# Patient Record
Sex: Male | Born: 1985 | Race: White | Hispanic: No | Marital: Married | Smoking: Current every day smoker
Health system: Southern US, Community
[De-identification: ages and names within clinical notes are randomized; demographics above are authoritative.]

---
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
## 2012-12-15 ENCOUNTER — Emergency Department (HOSPITAL_COMMUNITY)
Admission: EM | Admit: 2012-12-15 | Discharge: 2012-12-15 | Disposition: A | Discharge status: Home / Self Care | Payer: Self-pay | Attending: Emergency Medicine | Admitting: Emergency Medicine

## 2012-12-15 ENCOUNTER — Emergency Department (HOSPITAL_COMMUNITY): Payer: Self-pay

## 2012-12-15 ENCOUNTER — Encounter (HOSPITAL_COMMUNITY): Payer: Self-pay | Admitting: Emergency Medicine

## 2012-12-15 DIAGNOSIS — S02400A Malar fracture unspecified, initial encounter for closed fracture: Secondary | ICD-10-CM | POA: Insufficient documentation

## 2012-12-15 DIAGNOSIS — F17200 Nicotine dependence, unspecified, uncomplicated: Secondary | ICD-10-CM | POA: Insufficient documentation

## 2012-12-15 DIAGNOSIS — S02402A Zygomatic fracture, unspecified, initial encounter for closed fracture: Secondary | ICD-10-CM

## 2012-12-15 DIAGNOSIS — S02401A Maxillary fracture, unspecified, initial encounter for closed fracture: Secondary | ICD-10-CM | POA: Insufficient documentation

## 2012-12-15 MED ORDER — OXYCODONE-ACETAMINOPHEN 5-325 MG PO TABS
2.0000 | ORAL_TABLET | Freq: Once | ORAL | Status: AC
  Administered 2012-12-15: 2 via ORAL
  Filled 2012-12-15: qty 2

## 2012-12-15 MED ORDER — OXYCODONE-ACETAMINOPHEN 5-325 MG PO TABS: 1.0000 | ORAL_TABLET | ORAL | Status: DC | PRN

## 2012-12-15 MED ORDER — ONDANSETRON 4 MG PO TBDP
4.0000 mg | ORAL_TABLET | Freq: Once | ORAL | Status: AC
  Administered 2012-12-15: 4 mg via ORAL
  Filled 2012-12-15: qty 1
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
## 2012-12-16 ENCOUNTER — Encounter (HOSPITAL_COMMUNITY): Payer: Self-pay | Admitting: *Deleted

## 2012-12-16 ENCOUNTER — Inpatient Hospital Stay (HOSPITAL_COMMUNITY): Payer: Self-pay | Admitting: Anesthesiology

## 2012-12-16 ENCOUNTER — Encounter (HOSPITAL_COMMUNITY): Payer: Self-pay | Admitting: Anesthesiology

## 2012-12-16 ENCOUNTER — Ambulatory Visit (HOSPITAL_COMMUNITY)
Admission: AD | Admit: 2012-12-16 | Discharge: 2012-12-16 | Disposition: A | Discharge status: Home / Self Care | Payer: Self-pay | Source: Ambulatory Visit | Attending: Otolaryngology | Admitting: Otolaryngology

## 2012-12-16 ENCOUNTER — Encounter (HOSPITAL_COMMUNITY)
Admission: AD | Disposition: A | Discharge status: Home / Self Care | Payer: Self-pay | Source: Ambulatory Visit | Attending: Otolaryngology

## 2012-12-16 DIAGNOSIS — S02402A Zygomatic fracture, unspecified, initial encounter for closed fracture: Secondary | ICD-10-CM

## 2012-12-16 DIAGNOSIS — S02401A Maxillary fracture, unspecified, initial encounter for closed fracture: Secondary | ICD-10-CM | POA: Insufficient documentation

## 2012-12-16 DIAGNOSIS — S02400A Malar fracture unspecified, initial encounter for closed fracture: Secondary | ICD-10-CM | POA: Insufficient documentation

## 2012-12-16 HISTORY — PX: ORIF TRIPOD FRACTURE: SHX5211

## 2012-12-16 LAB — CBC
HCT: 43.5 % (ref 39.0–52.0)
Hemoglobin: 16 g/dL (ref 13.0–17.0)
MCH: 32.3 pg (ref 26.0–34.0)
MCHC: 36.8 g/dL — ABNORMAL HIGH (ref 30.0–36.0)
MCV: 87.7 fL (ref 78.0–100.0)

## 2012-12-16 SURGERY — OPEN REDUCTION INTERNAL FIXATION (ORIF) TRIPOD FRACTURE
Anesthesia: General | Site: Face | Laterality: Left | Wound class: Clean Contaminated

## 2012-12-16 MED ORDER — ONDANSETRON HCL 4 MG/2ML IJ SOLN
INTRAMUSCULAR | Status: DC | PRN
  Administered 2012-12-16: 4 mg via INTRAVENOUS

## 2012-12-16 MED ORDER — BACITRACIN ZINC 500 UNIT/GM EX OINT
TOPICAL_OINTMENT | CUTANEOUS | Status: AC
  Filled 2012-12-16: qty 15

## 2012-12-16 MED ORDER — CEFAZOLIN SODIUM 1-5 GM-% IV SOLN
INTRAVENOUS | Status: AC
  Filled 2012-12-16: qty 100

## 2012-12-16 MED ORDER — OXYCODONE HCL 5 MG/5ML PO SOLN: 5.0000 mg | Freq: Once | ORAL | Status: AC | PRN

## 2012-12-16 MED ORDER — AMOXICILLIN 875 MG PO TABS: 875.0000 mg | ORAL_TABLET | Freq: Two times a day (BID) | ORAL | Status: AC

## 2012-12-16 MED ORDER — HYDROMORPHONE HCL PF 1 MG/ML IJ SOLN
0.2500 mg | INTRAMUSCULAR | Status: DC | PRN
  Administered 2012-12-16 (×2): 0.5 mg via INTRAVENOUS

## 2012-12-16 MED ORDER — OXYMETAZOLINE HCL 0.05 % NA SOLN
NASAL | Status: AC
  Filled 2012-12-16: qty 15

## 2012-12-16 MED ORDER — OXYCODONE HCL 5 MG PO TABS
5.0000 mg | ORAL_TABLET | Freq: Once | ORAL | Status: AC | PRN
  Administered 2012-12-16: 5 mg via ORAL

## 2012-12-16 MED ORDER — HYDROMORPHONE HCL PF 1 MG/ML IJ SOLN
INTRAMUSCULAR | Status: AC
  Filled 2012-12-16: qty 1

## 2012-12-16 MED ORDER — CEFAZOLIN SODIUM-DEXTROSE 2-3 GM-% IV SOLR
INTRAVENOUS | Status: DC | PRN
  Administered 2012-12-16: 2 g via INTRAVENOUS

## 2012-12-16 MED ORDER — LIDOCAINE HCL (CARDIAC) 20 MG/ML IV SOLN
INTRAVENOUS | Status: DC | PRN
  Administered 2012-12-16: 100 mg via INTRAVENOUS

## 2012-12-16 MED ORDER — PROPOFOL 10 MG/ML IV BOLUS
INTRAVENOUS | Status: DC | PRN
  Administered 2012-12-16: 200 mg via INTRAVENOUS

## 2012-12-16 MED ORDER — ARTIFICIAL TEARS OP OINT
TOPICAL_OINTMENT | OPHTHALMIC | Status: DC | PRN
  Administered 2012-12-16: 1 via OPHTHALMIC

## 2012-12-16 MED ORDER — OXYCODONE HCL 5 MG PO TABS
ORAL_TABLET | ORAL | Status: AC
  Filled 2012-12-16: qty 1

## 2012-12-16 MED ORDER — PROMETHAZINE HCL 25 MG/ML IJ SOLN: 6.2500 mg | INTRAMUSCULAR | Status: DC | PRN

## 2012-12-16 MED ORDER — SODIUM CHLORIDE 0.9 % IR SOLN
Status: DC | PRN
  Administered 2012-12-16: 1000 mL

## 2012-12-16 MED ORDER — FENTANYL CITRATE 0.05 MG/ML IJ SOLN: 50.0000 ug | Freq: Once | INTRAMUSCULAR | Status: DC

## 2012-12-16 MED ORDER — LIDOCAINE-EPINEPHRINE 1 %-1:100000 IJ SOLN
INTRAMUSCULAR | Status: DC | PRN
  Administered 2012-12-16: 20 mL

## 2012-12-16 MED ORDER — TOBRAMYCIN-DEXAMETHASONE 0.3-0.1 % OP OINT
TOPICAL_OINTMENT | OPHTHALMIC | Status: AC
  Filled 2012-12-16: qty 3.5

## 2012-12-16 MED ORDER — LACTATED RINGERS IV SOLN
INTRAVENOUS | Status: DC | PRN
  Administered 2012-12-16: 19:00:00 via INTRAVENOUS

## 2012-12-16 MED ORDER — MIDAZOLAM HCL 5 MG/5ML IJ SOLN
INTRAMUSCULAR | Status: DC | PRN
  Administered 2012-12-16: 2 mg via INTRAVENOUS

## 2012-12-16 MED ORDER — OXYCODONE-ACETAMINOPHEN 5-325 MG PO TABS: 1.0000 | ORAL_TABLET | ORAL | Status: AC | PRN

## 2012-12-16 MED ORDER — METOCLOPRAMIDE HCL 5 MG/ML IJ SOLN
INTRAMUSCULAR | Status: DC | PRN
  Administered 2012-12-16: 10 mg via INTRAVENOUS

## 2012-12-16 MED ORDER — LACTATED RINGERS IV SOLN: INTRAVENOUS | Status: DC

## 2012-12-16 MED ORDER — SUCCINYLCHOLINE CHLORIDE 20 MG/ML IJ SOLN
INTRAMUSCULAR | Status: DC | PRN
  Administered 2012-12-16: 100 mg via INTRAVENOUS

## 2012-12-16 MED ORDER — BSS IO SOLN
INTRAOCULAR | Status: AC
  Filled 2012-12-16: qty 15

## 2012-12-16 MED ORDER — MIDAZOLAM HCL 2 MG/2ML IJ SOLN: 1.0000 mg | INTRAMUSCULAR | Status: DC | PRN

## 2012-12-16 MED ORDER — LIDOCAINE-EPINEPHRINE 1 %-1:100000 IJ SOLN
INTRAMUSCULAR | Status: AC
  Filled 2012-12-16: qty 1

## 2012-12-16 MED ORDER — FENTANYL CITRATE 0.05 MG/ML IJ SOLN
INTRAMUSCULAR | Status: DC | PRN
  Administered 2012-12-16: 50 ug via INTRAVENOUS
  Administered 2012-12-16: 100 ug via INTRAVENOUS
  Administered 2012-12-16: 50 ug via INTRAVENOUS

## 2012-12-16 SURGICAL SUPPLY — 35 items
BAG DECANTER FOR FLEXI CONT (MISCELLANEOUS) IMPLANT
CANISTER SUCTION 2500CC (MISCELLANEOUS) ×2 IMPLANT
CLEANER TIP ELECTROSURG 2X2 (MISCELLANEOUS) ×2 IMPLANT
CLOTH BEACON ORANGE TIMEOUT ST (SAFETY) ×2 IMPLANT
CONFORMERS SILICONE 5649 (OPHTHALMIC RELATED) IMPLANT
ELECT COATED BLADE 2.86 ST (ELECTRODE) ×1 IMPLANT
ELECT NDL BLADE 2-5/6 (NEEDLE) IMPLANT
ELECT NEEDLE BLADE 2-5/6 (NEEDLE) IMPLANT
ELECT REM PT RETURN 9FT ADLT (ELECTROSURGICAL) ×2
ELECTRODE REM PT RTRN 9FT ADLT (ELECTROSURGICAL) ×1 IMPLANT
GLOVE BIOGEL PI IND STRL 8 (GLOVE) IMPLANT
GLOVE BIOGEL PI INDICATOR 8 (GLOVE) ×1
GLOVE ECLIPSE 7.5 STRL STRAW (GLOVE) ×2 IMPLANT
GOWN STRL NON-REIN LRG LVL3 (GOWN DISPOSABLE) ×4 IMPLANT
KIT BASIN OR (CUSTOM PROCEDURE TRAY) ×2 IMPLANT
KIT ROOM TURNOVER OR (KITS) ×2 IMPLANT
MASK EYE SHIELD (GAUZE/BANDAGES/DRESSINGS) ×1 IMPLANT
NS IRRIG 1000ML POUR BTL (IV SOLUTION) ×2 IMPLANT
PAD ARMBOARD 7.5X6 YLW CONV (MISCELLANEOUS) ×4 IMPLANT
PENCIL BUTTON HOLSTER BLD 10FT (ELECTRODE) ×2 IMPLANT
PLATE 8 H STRAIGHT MID FACE (Plate) ×1 IMPLANT
SCISSORS WIRE ANG 4 3/4 DISP (INSTRUMENTS) IMPLANT
SCREW MIDFACE 1.7X3 SLF DRILL (Screw) ×4 IMPLANT
SUT PLAIN 6 0 TG1408 (SUTURE) IMPLANT
SUT PROLENE 5 0 C1 (SUTURE) IMPLANT
SUT PROLENE 5 0 RB 2 (SUTURE) ×1 IMPLANT
SUT VIC AB 3-0 FS2 27 (SUTURE) IMPLANT
SUT VIC AB 4-0 SH 27 (SUTURE) ×2
SUT VIC AB 4-0 SH 27XBRD (SUTURE) IMPLANT
TOWEL OR 17X24 6PK STRL BLUE (TOWEL DISPOSABLE) ×2 IMPLANT
TOWEL OR 17X26 10 PK STRL BLUE (TOWEL DISPOSABLE) ×2 IMPLANT
TRAY ENT MC OR (CUSTOM PROCEDURE TRAY) ×2 IMPLANT
TUBING IRRIGATION (MISCELLANEOUS) IMPLANT
WATER STERILE IRR 1000ML POUR (IV SOLUTION) ×1 IMPLANT
YANKAUER SUCT BULB TIP NO VENT (SUCTIONS) ×1 IMPLANT

## 2012-12-16 NOTE — Progress Notes (Signed)
DtSuszanne Conners at bedside states pt will have blereding to just change nasal drip pad as needed

## 2012-12-16 NOTE — Brief Op Note (Signed)
12/16/2012  8:16 PM  PATIENT:  Nicholas [name]  27 y.o. male  PRE-OPERATIVE DIAGNOSIS:  Displaced left Tripod Fx  POST-OPERATIVE DIAGNOSIS:  Displaced left Tripod Fx  PROCEDURE:  Procedure(s): OPEN REDUCTION INTERNAL FIXATION (ORIF) LEFT TRIPOD FRACTURE VIA MULTIPLE APPROACHES  SURGEON:  Surgeon(s) and Role:    * Sui W Ahilyn Nell, MD - Primary  PHYSICIAN ASSISTANT:   ASSISTANTS: none   ANESTHESIA:   general  EBL:     BLOOD ADMINISTERED:none  DRAINS: none   LOCAL MEDICATIONS USED:  LIDOCAINE   SPECIMEN:  No Specimen  DISPOSITION OF SPECIMEN:  N/A  COUNTS:  YES  TOURNIQUET:  * No tourniquets in log *  DICTATION: .Other Dictation: Dictation Number [id number]  PLAN OF CARE: Discharge to home after PACU  PATIENT DISPOSITION:  PACU - hemodynamically stable.   Delay start of Pharmacological VTE agent (>24hrs) due to surgical blood loss or risk of bleeding: not applicable
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
## 2012-12-16 NOTE — Progress Notes (Signed)
Drip pad changed second time for moderate amount of bright red blood

## 2012-12-16 NOTE — Transfer of Care (Signed)
Immediate Anesthesia Transfer of Care Note  Patient: Nicholas [name]  Procedure(s) Performed: Procedure(s): OPEN REDUCTION INTERNAL FIXATION (ORIF) TRIPOD FRACTURE with multiple approaches (Left)  Patient Location: PACU  Anesthesia Type:General  Level of Consciousness: awake, alert  and oriented  Airway & Oxygen Therapy: Patient Spontanous Breathing  Post-op Assessment: Report given to PACU RN, Post -op Vital signs reviewed and stable and Patient moving all extremities X 4  Post vital signs: Reviewed and stable  Complications: No apparent anesthesia complications
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
## 2012-12-16 NOTE — Progress Notes (Signed)
Dr. Suszanne Conners called pt has bleeding from nose drip pad applied

## 2012-12-16 NOTE — Anesthesia Procedure Notes (Signed)
Procedure Name: Intubation Date/Time: 12/16/2012 7:12 PM Performed by: Molli Hazard Pre-anesthesia Checklist: Patient identified, Emergency Drugs available, Suction available and Patient being monitored Patient Re-evaluated:Patient Re-evaluated prior to inductionOxygen Delivery Method: Circle system utilized Preoxygenation: Pre-oxygenation with 100% oxygen Intubation Type: IV induction Ventilation: Mask ventilation without difficulty Laryngoscope Size: Miller and 2 Grade View: Grade I Tube type: Oral Tube size: 7.5 mm Number of attempts: 1 Airway Equipment and Method: Stylet Placement Confirmation: ETT inserted through vocal cords under direct vision,  positive ETCO2 and breath sounds checked- equal and bilateral Secured at: 22 cm Tube secured with: Tape Dental Injury: Teeth and Oropharynx as per pre-operative assessment

## 2012-12-16 NOTE — Preoperative (Signed)
Beta Blockers   Reason not to administer Beta Blockers:Not Applicable 

## 2012-12-17 ENCOUNTER — Encounter (HOSPITAL_COMMUNITY): Payer: Self-pay | Admitting: Otolaryngology

## 2012-12-17 NOTE — Anesthesia Postprocedure Evaluation (Signed)
  Anesthesia Post-op Note  Patient: Nicholas [name]  Procedure(s) Performed: Procedure(s): OPEN REDUCTION INTERNAL FIXATION (ORIF) TRIPOD FRACTURE with multiple approaches (Left)  Patient Location: PACU  Anesthesia Type:General  Level of Consciousness: awake and alert   Airway and Oxygen Therapy: Patient Spontanous Breathing  Post-op Pain: mild  Post-op Assessment: Post-op Vital signs reviewed, Patient's Cardiovascular Status Stable, Respiratory Function Stable, Patent Airway, No signs of Nausea or vomiting and Pain level controlled  Post-op Vital Signs: stable  Complications: No apparent anesthesia complications
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
## 2012-12-18 NOTE — ED Provider Notes (Signed)
Medical screening examination/treatment/procedure(s) were performed by non-physician practitioner and as supervising physician I was immediately available for consultation/collaboration.  Toy Baker, MD 12/18/12 [phone]
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
## 2019-09-01 ENCOUNTER — Ambulatory Visit (INDEPENDENT_AMBULATORY_CARE_PROVIDER_SITE_OTHER): Payer: Self-pay | Admitting: Adult Health

## 2019-09-01 ENCOUNTER — Encounter: Payer: Self-pay | Admitting: Adult Health

## 2019-09-01 VITALS — BP 118/72 | HR 84 | Ht 70.0 in | Wt 142.0 lb

## 2019-09-01 DIAGNOSIS — F411 Generalized anxiety disorder: Secondary | ICD-10-CM

## 2019-09-01 DIAGNOSIS — F902 Attention-deficit hyperactivity disorder, combined type: Secondary | ICD-10-CM

## 2019-09-01 NOTE — Progress Notes (Signed)
[facility] MD/PA/NP Initial Note  09/01/2019 10:46 AM Nicholas [name]  MRN:  [id number]  Chief Complaint:   HPI: Describes mood today as - Mood symptoms - reports anxiety "most" of the times. Stating "I get so nervous sometimes, I can't do what I need to do". Gets "more" irritated at things that may not bother other people. Gets overstimulated easy. Denies depression. Would like Stable interest and motivation. Taking medications as prescribed.  Energy levels stable. Active, does not have a regular exercise routine. Works full-time - 40 hours. Enjoys some usual interests and activities. Single. Married - together 5 years. Has a 51 year old son (wife's son) and an 59 month old daughter. Family local. Both sets of parents live in [place]. Spending time with family. Appetite adequate - "it comes and goes". Weight stable up until a year ago. Increased waist by 3 inches over the past hear.  Sleeps well most nights. Averages 7 to 8 hours.feels "rested". Focus and concentration difficulties. Previously treated for and diagnosed with ADHD by Dr. Sabra Heck - age 65. Completing tasks. Managing aspects of household. 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 Denies SI or HI. Denies AH or VH.  Previous medication trials: Ritalin, Adderall, Concerta, Stratera  Visit Diagnosis:    ICD-10-CM   1. Generalized anxiety disorder  F41.1   2. Attention deficit hyperactivity disorder (ADHD), unspecified ADHD type  F90.9     Past Psychiatric History: Denies psychiatric hospitalization.  Past Medical History: No past medical history on file.   Family Psychiatric History: Mother - unknown.  Family History: No family history on file.  Social History:  Social History   Socioeconomic History  . Marital status: Single    Spouse name: Not on file  . Number of children: Not on file  . Years of education: Not on file  . Highest education level: Not on file  Occupational History  . Not on file   Tobacco Use  . Smoking status: Not on file  Substance and Sexual Activity  . Alcohol use: Not on file  . Drug use: Not on file  . Sexual activity: Not on file  Other Topics Concern  . Not on file  Social History Narrative  . Not on file   Social Determinants of Health   Financial Resource Strain:   . Difficulty of Paying Living Expenses:   Food Insecurity:   . Worried About Charity fundraiser in the Last Year:   . Arboriculturist in the Last Year:   Transportation Needs:   . Film/video editor (Medical):   Marland Kitchen Lack of Transportation (Non-Medical):   Physical Activity:   . Days of Exercise per Week:   . Minutes of Exercise per Session:   Stress:   . Feeling of Stress :   Social Connections:   . Frequency of Communication with Friends and Family:   . Frequency of Social Gatherings with Friends and Family:   . Attends Religious Services:   . Active Member of Clubs or Organizations:   . Attends Archivist Meetings:   Marland Kitchen Marital Status:     Allergies: Not on File  Metabolic Disorder Labs: No results found for: HGBA1C, MPG No results found for: PROLACTIN No results found for: CHOL, TRIG, HDL, CHOLHDL, VLDL, LDLCALC No results found for: TSH  Therapeutic Level Labs: No results found for: LITHIUM No results found for: VALPROATE No components found for:  CBMZ  Current  Medications: No current outpatient medications on file.   No current facility-administered medications for this visit.    Medication Side Effects: none  Orders placed this visit:  No orders of the defined types were placed in this encounter.  Psychiatric Specialty Exam:  Review of Systems  Blood pressure 130/81, pulse 82, height 5\' 10"  (1.778 m), weight 180 lb (81.6 kg).Body mass index is 25.83 kg/m.  General Appearance: Neat and Well Groomed  Eye Contact:  Good  Speech:  Clear and Coherent and Normal Rate  Volume:  Normal  Mood:  Anxious  Affect:  Appropriate and Congruent   Thought Process:  Coherent and Descriptions of Associations: Intact  Orientation:  Full (Time, Place, and Person)  Thought Content: Logical   Suicidal Thoughts:  No  Homicidal Thoughts:  No  Memory:  WNL  Judgement:  Good  Insight:  Good  Psychomotor Activity:  Normal  Concentration:  Concentration: Good  Recall:  Good  Fund of Knowledge: Good  Language: Good  Assets:  Communication Skills Desire for Improvement Financial Resources/Insurance Housing Intimacy Leisure Time Physical Health Resilience Social Support Talents/Skills Transportation Vocational/Educational  ADL's:  Intact  Cognition: WNL  Prognosis:  Good   Screenings: ADHD  Receiving Psychotherapy: No   Treatment Plan/Recommendations:  Plan:  PDMP reviewed  1. Add Adderall XR 20mg  every morning  Psych Central ADHD testing score - 42/58  RTC 4 weeks  Patient advised to contact office with any questions, adverse effects, or acute worsening in signs and symptoms.  Discussed potential benefits, risks, and side effects of stimulants with patient to include increased heart rate, palpitations, insomnia, increased anxiety, increased irritability, or decreased appetite.  Instructed patient to contact office if experiencing any significant tolerability issues.  Greater than 50% of face to face time with patient was spent on counseling and coordination of care. We discussed ADHD - diet, vitamin supplements, medication, ADDitude website.   , NP

## 2019-09-29 ENCOUNTER — Other Ambulatory Visit: Payer: Self-pay

## 2019-09-29 ENCOUNTER — Ambulatory Visit (INDEPENDENT_AMBULATORY_CARE_PROVIDER_SITE_OTHER): Payer: Self-pay | Admitting: Adult Health

## 2019-09-29 ENCOUNTER — Encounter: Payer: Self-pay | Admitting: Adult Health

## 2019-09-29 DIAGNOSIS — F902 Attention-deficit hyperactivity disorder, combined type: Secondary | ICD-10-CM

## 2019-09-29 DIAGNOSIS — F411 Generalized anxiety disorder: Secondary | ICD-10-CM

## 2019-09-29 MED ORDER — AMPHETAMINE-DEXTROAMPHETAMINE 20 MG PO TABS: 20.0000 mg | ORAL_TABLET | Freq: Two times a day (BID) | ORAL | 0 refills | Status: DC

## 2019-09-29 NOTE — Progress Notes (Signed)
Nicholas [name] [id number] [birth year] 34 y.o.  Subjective:   Patient ID:  Nicholas [name] is a 34 y.o. (DOB 01-Oct-1985) male.  Chief Complaint: No chief complaint on file.   HPI Nicholas [name] presents to the office today for follow-up of ADHD and GAD.  Describes mood today as "ok". Pleasant. Mood symptoms - reports decreased anxiety - "that is better, but still there". Not getting as irritated over things. Denies depression. Feels like addition of Adderall has ben helpful. Woould like to try the immediate release instead of the XR - "it may be keeping me too activated" - some difficulties getting to sleep. Stable interest and motivation. Taking medications as prescribed.  Energy levels stable. Active, does not have a regular exercise routine. Works full-time - 40 hours. Enjoys some usual interests and activities. Single. Married - together 5 years. Has a 35 year old son (wife's son) and an 11 month old daughter. Family local. Both sets of parents live in [place]. Spending time with family. Appetite adequate. Weight loss - 174 pounds - lost 6 pounds.  Sleeps well most nights. Averages 7 to 8 hours.  Focus and concentration has improved. Completing tasks. Managing aspects of household. Works as a Psychologist, occupational.  Denies SI or HI. Denies AH or VH.  Previous medication trials: Ritalin, Adderall, Concerta, Stratera   Review of Systems:  Review of Systems  Musculoskeletal: Negative for gait problem.  Neurological: Negative for tremors.  Psychiatric/Behavioral:       Please refer to HPI    Medications: I have reviewed the patient's current medications.  Current Outpatient Medications  Medication Sig Dispense Refill  . amphetamine-dextroamphetamine (ADDERALL) 20 MG tablet Take 1 tablet (20 mg total) by mouth 2 (two) times daily. 60 tablet 0  . ibuprofen (ADVIL,MOTRIN) 200 MG tablet Take 1,000 mg by mouth every 6 (six) hours as needed for pain.    Marland Kitchen oxyCODONE-acetaminophen (ROXICET) 5-325 MG  per tablet Take 1-2 tablets by mouth every 4 (four) hours as needed for pain. 30 tablet 0   No current facility-administered medications for this visit.    Medication Side Effects: None  Allergies: No Known Allergies  No past medical history on file.  No family history on file.  Social History   Socioeconomic History  . Marital status: Single    Spouse name: Not on file  . Number of children: Not on file  . Years of education: Not on file  . Highest education level: Not on file  Occupational History  . Not on file  Tobacco Use  . Smoking status: Current Every Day Smoker    Packs/day: 1.00    Years: 6.00    Pack years: 6.00  . Smokeless tobacco: Never Used  Substance and Sexual Activity  . Alcohol use: Yes    Comment: occasionally  . Drug use: Yes    Types: Marijuana    Comment: last used on 12-15-12  . Sexual activity: Yes  Other Topics Concern  . Not on file  Social History Narrative  . Not on file   Social Determinants of Health   Financial Resource Strain:   . Difficulty of Paying Living Expenses:   Food Insecurity:   . Worried About Programme researcher, broadcasting/film/video in the Last Year:   . Barista in the Last Year:   Transportation Needs:   . Freight forwarder (Medical):   Marland Kitchen Lack of Transportation (Non-Medical):   Physical Activity:   . Days of Exercise per  Week:   . Minutes of Exercise per Session:   Stress:   . Feeling of Stress :   Social Connections:   . Frequency of Communication with Friends and Family:   . Frequency of Social Gatherings with Friends and Family:   . Attends Religious Services:   . Active Member of Clubs or Organizations:   . Attends Archivist Meetings:   Marland Kitchen Marital Status:   Intimate Partner Violence:   . Fear of Current or Ex-Partner:   . Emotionally Abused:   Marland Kitchen Physically Abused:   . Sexually Abused:     Past Medical History, Surgical history, Social history, and Family history were reviewed and updated as  appropriate.   Please see review of systems for further details on the patient's review from today.   Objective:   Physical Exam:  There were no vitals taken for this visit.  Physical Exam Constitutional:      General: He is not in acute distress. Musculoskeletal:        General: No deformity.  Neurological:     Mental Status: He is alert and oriented to person, place, and time.     Coordination: Coordination normal.  Psychiatric:        Attention and Perception: Attention and perception normal. He does not perceive auditory or visual hallucinations.        Mood and Affect: Mood normal. Mood is not anxious or depressed. Affect is not labile, blunt, angry or inappropriate.        Speech: Speech normal.        Behavior: Behavior normal.        Thought Content: Thought content normal. Thought content is not paranoid or delusional. Thought content does not include homicidal or suicidal ideation. Thought content does not include homicidal or suicidal plan.        Cognition and Memory: Cognition and memory normal.        Judgment: Judgment normal.     Comments: Insight intact     Lab Review:  No results found for: NA, K, CL, CO2, GLUCOSE, BUN, CREATININE, CALCIUM, PROT, ALBUMIN, AST, ALT, ALKPHOS, BILITOT, GFRNONAA, GFRAA     Component Value Date/Time   WBC 8.7 12/16/2012 1444   RBC 4.96 12/16/2012 1444   HGB 16.0 12/16/2012 1444   HCT 43.5 12/16/2012 1444   PLT 190 12/16/2012 1444   MCV 87.7 12/16/2012 1444   MCH 32.3 12/16/2012 1444   MCHC 36.8 (H) 12/16/2012 1444   RDW 13.2 12/16/2012 1444    No results found for: POCLITH, LITHIUM   No results found for: PHENYTOIN, PHENOBARB, VALPROATE, CBMZ   .res Assessment: Plan:    Plan:  PDMP reviewed  1. D/C Adderall XR 20mg  every morning 2. Add Adderal 20mg  BID  Consider low dose Tranxene for anxiety  Psych Central ADHD testing score - 42/58  RTC 4 weeks  Patient advised to contact office with any questions,  adverse effects, or acute worsening in signs and symptoms.  Discussed potential benefits, risks, and side effects of stimulants with patient to include increased heart rate, palpitations, insomnia, increased anxiety, increased irritability, or decreased appetite.  Instructed patient to contact office if experiencing any significant tolerability issues.    Diagnoses and all orders for this visit:  Attention deficit hyperactivity disorder (ADHD), combined type -     amphetamine-dextroamphetamine (ADDERALL) 20 MG tablet; Take 1 tablet (20 mg total) by mouth 2 (two) times daily.  Generalized anxiety disorder  Please see After Visit Summary for patient specific instructions.  No future appointments.  No orders of the defined types were placed in this encounter.   -------------------------------

## 2019-10-27 ENCOUNTER — Ambulatory Visit: Payer: Self-pay | Admitting: Adult Health

## 2019-11-11 ENCOUNTER — Ambulatory Visit: Payer: Self-pay | Admitting: Adult Health

## 2019-11-17 ENCOUNTER — Other Ambulatory Visit: Payer: Self-pay

## 2019-11-17 ENCOUNTER — Ambulatory Visit (INDEPENDENT_AMBULATORY_CARE_PROVIDER_SITE_OTHER): Payer: Self-pay | Admitting: Adult Health

## 2019-11-17 ENCOUNTER — Encounter: Payer: Self-pay | Admitting: Adult Health

## 2019-11-17 DIAGNOSIS — F902 Attention-deficit hyperactivity disorder, combined type: Secondary | ICD-10-CM | POA: Insufficient documentation

## 2019-11-17 DIAGNOSIS — F411 Generalized anxiety disorder: Secondary | ICD-10-CM | POA: Insufficient documentation

## 2019-11-17 DIAGNOSIS — F909 Attention-deficit hyperactivity disorder, unspecified type: Secondary | ICD-10-CM

## 2019-11-17 MED ORDER — AMPHETAMINE-DEXTROAMPHETAMINE 20 MG PO TABS: 20.0000 mg | ORAL_TABLET | Freq: Two times a day (BID) | ORAL | 0 refills | Status: DC

## 2019-11-17 NOTE — Progress Notes (Signed)
Nicholas [name] [id number] 30-Nov-1985 34 y.o.  Subjective:   Patient ID:  Nicholas [name] is a 34 y.o. (DOB 1986-03-20) male.  Chief Complaint: No chief complaint on file.   HPI Nicholas [name] presents to the office today for follow-up of ADHD and GAD.  Describes mood today as "ok". Pleasant. Mood symptoms - reports anxiety at times. Denies depression. Decreased irritability. Stating "I feel fantastic". Feels like the immediate release Adderall is work well. Feels more "even" on it. Stable interest and motivation. Taking medications as prescribed.  Energy levels stable. Active, does not have a regular exercise routine. Works full-time - 40 hours. Enjoys some usual interests and activities. Single. Married - together 5 years. Has a 53 year old son (wife's son) and an 55 month old daughter. Family local. Both sets of parents live in [place]. Spending time with family. Appetite adequate. Weight stable. Sleeps well most nights. Averages 7 to 8 hours.  Focus and concentration has improved. Completing tasks. Managing aspects of household. Works as a Psychologist, occupational.   Denies SI or HI. Denies AH or VH.  Previous medication trials: Ritalin, Adderall, Concerta, Stratera   Review of Systems:  Review of Systems  Musculoskeletal: Negative for gait problem.  Neurological: Negative for tremors.  Psychiatric/Behavioral:       Please refer to HPI    Medications: I have reviewed the patient's current medications.  Current Outpatient Medications  Medication Sig Dispense Refill  . amphetamine-dextroamphetamine (ADDERALL) 20 MG tablet Take 1 tablet (20 mg total) by mouth 2 (two) times daily. 60 tablet 0  . [START ON 12/15/2019] amphetamine-dextroamphetamine (ADDERALL) 20 MG tablet Take 1 tablet (20 mg total) by mouth 2 (two) times daily. 60 tablet 0  . [START ON 01/12/2020] amphetamine-dextroamphetamine (ADDERALL) 20 MG tablet Take 1 tablet (20 mg total) by mouth 2 (two) times daily. 60 tablet 0  .  ibuprofen (ADVIL,MOTRIN) 200 MG tablet Take 1,000 mg by mouth every 6 (six) hours as needed for pain.    Marland Kitchen oxyCODONE-acetaminophen (ROXICET) 5-325 MG per tablet Take 1-2 tablets by mouth every 4 (four) hours as needed for pain. 30 tablet 0   No current facility-administered medications for this visit.    Medication Side Effects: None  Allergies: No Known Allergies  No past medical history on file.  No family history on file.  Social History   Socioeconomic History  . Marital status: Single    Spouse name: Not on file  . Number of children: Not on file  . Years of education: Not on file  . Highest education level: Not on file  Occupational History  . Not on file  Tobacco Use  . Smoking status: Current Every Day Smoker    Packs/day: 1.00    Years: 6.00    Pack years: 6.00  . Smokeless tobacco: Never Used  Substance and Sexual Activity  . Alcohol use: Yes    Comment: occasionally  . Drug use: Yes    Types: Marijuana    Comment: last used on 12-15-12  . Sexual activity: Yes  Other Topics Concern  . Not on file  Social History Narrative  . Not on file   Social Determinants of Health   Financial Resource Strain:   . Difficulty of Paying Living Expenses:   Food Insecurity:   . Worried About Programme researcher, broadcasting/film/video in the Last Year:   . Barista in the Last Year:   Transportation Needs:   . Freight forwarder (Medical):   Marland Kitchen  Lack of Transportation (Non-Medical):   Physical Activity:   . Days of Exercise per Week:   . Minutes of Exercise per Session:   Stress:   . Feeling of Stress :   Social Connections:   . Frequency of Communication with Friends and Family:   . Frequency of Social Gatherings with Friends and Family:   . Attends Religious Services:   . Active Member of Clubs or Organizations:   . Attends Banker Meetings:   Marland Kitchen Marital Status:   Intimate Partner Violence:   . Fear of Current or Ex-Partner:   . Emotionally Abused:   Marland Kitchen  Physically Abused:   . Sexually Abused:     Past Medical History, Surgical history, Social history, and Family history were reviewed and updated as appropriate.   Please see review of systems for further details on the patient's review from today.   Objective:   Physical Exam:  There were no vitals taken for this visit.  Physical Exam Constitutional:      General: He is not in acute distress. Musculoskeletal:        General: No deformity.  Neurological:     Mental Status: He is alert and oriented to person, place, and time.     Coordination: Coordination normal.  Psychiatric:        Attention and Perception: Attention and perception normal. He does not perceive auditory or visual hallucinations.        Mood and Affect: Mood normal. Mood is not anxious or depressed. Affect is not labile, blunt, angry or inappropriate.        Speech: Speech normal.        Behavior: Behavior normal.        Thought Content: Thought content normal. Thought content is not paranoid or delusional. Thought content does not include homicidal or suicidal ideation. Thought content does not include homicidal or suicidal plan.        Cognition and Memory: Cognition and memory normal.        Judgment: Judgment normal.     Comments: Insight intact     Lab Review:  No results found for: NA, K, CL, CO2, GLUCOSE, BUN, CREATININE, CALCIUM, PROT, ALBUMIN, AST, ALT, ALKPHOS, BILITOT, GFRNONAA, GFRAA     Component Value Date/Time   WBC 8.7 12/16/2012 1444   RBC 4.96 12/16/2012 1444   HGB 16.0 12/16/2012 1444   HCT 43.5 12/16/2012 1444   PLT 190 12/16/2012 1444   MCV 87.7 12/16/2012 1444   MCH 32.3 12/16/2012 1444   MCHC 36.8 (H) 12/16/2012 1444   RDW 13.2 12/16/2012 1444    No results found for: POCLITH, LITHIUM   No results found for: PHENYTOIN, PHENOBARB, VALPROATE, CBMZ   .res Assessment: Plan:    Plan:  PDMP reviewed  1. D/C Adderall XR 20mg  every morning 2. Add Adderal 20mg  BID  Consider  low dose Tranxene for anxiety  Psych Central ADHD testing score - 42/58  RTC 4 weeks  Patient advised to contact office with any questions, adverse effects, or acute worsening in signs and symptoms.  Discussed potential benefits, risks, and side effects of stimulants with patient to include increased heart rate, palpitations, insomnia, increased anxiety, increased irritability, or decreased appetite.  Instructed patient to contact office if experiencing any significant tolerability issues.    Diagnoses and all orders for this visit:  Generalized anxiety disorder  Attention deficit hyperactivity disorder (ADHD), combined type -     amphetamine-dextroamphetamine (ADDERALL) 20 MG tablet; Take  1 tablet (20 mg total) by mouth 2 (two) times daily. -     amphetamine-dextroamphetamine (ADDERALL) 20 MG tablet; Take 1 tablet (20 mg total) by mouth 2 (two) times daily. -     amphetamine-dextroamphetamine (ADDERALL) 20 MG tablet; Take 1 tablet (20 mg total) by mouth 2 (two) times daily.  Attention deficit hyperactivity disorder (ADHD), unspecified ADHD type  GAD (generalized anxiety disorder)     Please see After Visit Summary for patient specific instructions.  Future Appointments  Date Time Provider Department Center  02/17/2020 10:20 AM Lyrik Dockstader, Thereasa Solo, NP [facility] None    No orders of the defined types were placed in this encounter.   -------------------------------

## 2019-12-28 ENCOUNTER — Ambulatory Visit: Payer: Self-pay | Admitting: Mental Health

## 2020-01-03 ENCOUNTER — Telehealth: Payer: Self-pay | Admitting: Adult Health

## 2020-01-03 NOTE — Telephone Encounter (Signed)
Yes

## 2020-01-03 NOTE — Telephone Encounter (Signed)
Pt requesting refill for Adderall  20 mg 2/d @ [facility] Location.  Pt moved and wants to change pharmacy location for [facility] to  [facility]. [address].  Apt 11/4

## 2020-01-03 NOTE — Telephone Encounter (Signed)
Last refill 11/17/19 Has 2 other Rx's pended at [facility] but wants to switch to [facility].   Okay to pend?

## 2020-01-04 ENCOUNTER — Other Ambulatory Visit: Payer: Self-pay

## 2020-01-04 DIAGNOSIS — F902 Attention-deficit hyperactivity disorder, combined type: Secondary | ICD-10-CM

## 2020-01-04 MED ORDER — AMPHETAMINE-DEXTROAMPHETAMINE 20 MG PO TABS: 20.0000 mg | ORAL_TABLET | Freq: Two times a day (BID) | ORAL | 0 refills | Status: DC

## 2020-01-04 NOTE — Telephone Encounter (Signed)
Corrected pharmacy to [facility] at [facility], Dr. Jennelle Human sent Rx for today's refill only

## 2020-01-04 NOTE — Telephone Encounter (Signed)
Rx's pended to be sent to new pharmacy

## 2020-01-04 NOTE — Telephone Encounter (Addendum)
Pt called Rx went to [facility]. Should be [facility] location. Please delete all pharmacies but [facility] location. Ask if any way possible please correct today.Pt called several times to get pharmacy changed.

## 2020-01-25 ENCOUNTER — Ambulatory Visit: Payer: Self-pay | Admitting: Mental Health

## 2020-02-17 ENCOUNTER — Ambulatory Visit: Payer: Self-pay | Admitting: Adult Health

## 2020-02-17 ENCOUNTER — Other Ambulatory Visit: Payer: Self-pay

## 2020-02-23 ENCOUNTER — Encounter: Payer: Self-pay | Admitting: Adult Health

## 2020-02-23 ENCOUNTER — Ambulatory Visit (INDEPENDENT_AMBULATORY_CARE_PROVIDER_SITE_OTHER): Payer: Self-pay | Admitting: Adult Health

## 2020-02-23 ENCOUNTER — Other Ambulatory Visit: Payer: Self-pay

## 2020-02-23 DIAGNOSIS — F39 Unspecified mood [affective] disorder: Secondary | ICD-10-CM

## 2020-02-23 DIAGNOSIS — F411 Generalized anxiety disorder: Secondary | ICD-10-CM

## 2020-02-23 DIAGNOSIS — F902 Attention-deficit hyperactivity disorder, combined type: Secondary | ICD-10-CM

## 2020-02-23 MED ORDER — AMPHETAMINE-DEXTROAMPHET ER 30 MG PO CP24: 30.0000 mg | ORAL_CAPSULE | Freq: Every day | ORAL | 0 refills | Status: DC

## 2020-02-23 MED ORDER — ARIPIPRAZOLE 2 MG PO TABS: 2.0000 mg | ORAL_TABLET | Freq: Every day | ORAL | 2 refills | Status: DC

## 2020-02-23 NOTE — Progress Notes (Signed)
Nicholas [name] [id number] 1985/11/09 34 y.o.  Subjective:   Patient ID:  Nicholas [name] is a 34 y.o. (DOB November 08, 1985) male.  Chief Complaint: No chief complaint on file.   HPI Nicholas [name] presents to the office today for follow-up of ADHD and GAD.  Describes mood today as "ok". Pleasant. Mood symptoms - reports irritability and anxiety. Denies depression. Stating "I feel alright". Concerned about mood when the ADHD medication "wears" off in the afternoon. Gets "snappy" at home. Stating "I feel it coming on when the Adderall wears off". Stable interest and motivation. Taking medications as prescribed.  Energy levels stable. Active, does not have a regular exercise routine. Walking dogs. Enjoys some usual interests and activities. Single. Married - together 5 years. Has a 38 year old son (wife's son) and an 73 month old daughter. Family local. Both sets of parents live in [place]. Spending time with family. Appetite adequate. Weight stable. Sleeps well most nights. Averages 5 to 6 hours. Getting up earlier for work. Focus and concentration improved. Completing tasks. Managing aspects of household. Works as a Psychologist, occupational.   Denies SI or HI.  Denies AH or VH.  Previous medication trials: Ritalin, Adderall, Concerta, Stratera   Review of Systems:  Review of Systems  Musculoskeletal: Negative for gait problem.  Neurological: Negative for tremors.  Psychiatric/Behavioral:       Please refer to HPI    Medications: I have reviewed the patient's current medications.  Current Outpatient Medications  Medication Sig Dispense Refill   amphetamine-dextroamphetamine (ADDERALL XR) 30 MG 24 hr capsule Take 1 capsule (30 mg total) by mouth daily. 30 capsule 0   ARIPiprazole (ABILIFY) 2 MG tablet Take 1 tablet (2 mg total) by mouth daily. 30 tablet 2   ibuprofen (ADVIL,MOTRIN) 200 MG tablet Take 1,000 mg by mouth every 6 (six) hours as needed for pain.     oxyCODONE-acetaminophen  (ROXICET) 5-325 MG per tablet Take 1-2 tablets by mouth every 4 (four) hours as needed for pain. 30 tablet 0   No current facility-administered medications for this visit.    Medication Side Effects: None  Allergies: No Known Allergies  No past medical history on file.  No family history on file.  Social History   Socioeconomic History   Marital status: Single    Spouse name: Not on file   Number of children: Not on file   Years of education: Not on file   Highest education level: Not on file  Occupational History   Not on file  Tobacco Use   Smoking status: Current Every Day Smoker    Packs/day: 1.00    Years: 6.00    Pack years: 6.00   Smokeless tobacco: Never Used  Substance and Sexual Activity   Alcohol use: Yes    Comment: occasionally   Drug use: Yes    Types: Marijuana    Comment: last used on 12-15-12   Sexual activity: Yes  Other Topics Concern   Not on file  Social History Narrative   Not on file   Social Determinants of Health   Financial Resource Strain:    Difficulty of Paying Living Expenses: Not on file  Food Insecurity:    Worried About Running Out of Food in the Last Year: Not on file   [employer] of Food in the Last Year: Not on file  Transportation Needs:    Lack of Transportation (Medical): Not on file   Lack of Transportation (Non-Medical): Not on file  Physical Activity:    Days of Exercise per Week: Not on file   Minutes of Exercise per Session: Not on file  Stress:    Feeling of Stress : Not on file  Social Connections:    Frequency of Communication with Friends and Family: Not on file   Frequency of Social Gatherings with Friends and Family: Not on file   Attends Religious Services: Not on file   Active Member of Clubs or Organizations: Not on file   Attends Banker Meetings: Not on file   Marital Status: Not on file  Intimate Partner Violence:    Fear of Current or Ex-Partner: Not on file    Emotionally Abused: Not on file   Physically Abused: Not on file   Sexually Abused: Not on file    Past Medical History, Surgical history, Social history, and Family history were reviewed and updated as appropriate.   Please see review of systems for further details on the patient's review from today.   Objective:   Physical Exam:  There were no vitals taken for this visit.  Physical Exam Constitutional:      General: He is not in acute distress. Musculoskeletal:        General: No deformity.  Neurological:     Mental Status: He is alert and oriented to person, place, and time.     Coordination: Coordination normal.  Psychiatric:        Attention and Perception: Attention and perception normal. He does not perceive auditory or visual hallucinations.        Mood and Affect: Mood normal. Mood is not anxious or depressed. Affect is not labile, blunt, angry or inappropriate.        Speech: Speech normal.        Behavior: Behavior normal.        Thought Content: Thought content normal. Thought content is not paranoid or delusional. Thought content does not include homicidal or suicidal ideation. Thought content does not include homicidal or suicidal plan.        Cognition and Memory: Cognition and memory normal.        Judgment: Judgment normal.     Comments: Insight intact     Lab Review:  No results found for: NA, K, CL, CO2, GLUCOSE, BUN, CREATININE, CALCIUM, PROT, ALBUMIN, AST, ALT, ALKPHOS, BILITOT, GFRNONAA, GFRAA     Component Value Date/Time   WBC 8.7 12/16/2012 1444   RBC 4.96 12/16/2012 1444   HGB 16.0 12/16/2012 1444   HCT 43.5 12/16/2012 1444   PLT 190 12/16/2012 1444   MCV 87.7 12/16/2012 1444   MCH 32.3 12/16/2012 1444   MCHC 36.8 (H) 12/16/2012 1444   RDW 13.2 12/16/2012 1444    No results found for: POCLITH, LITHIUM   No results found for: PHENYTOIN, PHENOBARB, VALPROATE, CBMZ   .res Assessment: Plan:    Plan:  PDMP reviewed  1. Add Adderall  XR 30mg  every morning 2. D/C Adderal 20mg  BID 3. Add Abilify 2mg  daily  [facility] ADHD testing score - 42/58  RTC 4 weeks  Patient advised to contact office with any questions, adverse effects, or acute worsening in signs and symptoms.  Discussed potential benefits, risks, and side effects of stimulants with patient to include increased heart rate, palpitations, insomnia, increased anxiety, increased irritability, or decreased appetite.  Instructed patient to contact office if experiencing any significant tolerability issues.  Discussed potential metabolic side effects associated with atypical antipsychotics, as well as potential risk  for movement side effects. Advised pt to contact office if movement side effects occur.     Diagnoses and all orders for this visit:  Attention deficit hyperactivity disorder (ADHD), combined type -     amphetamine-dextroamphetamine (ADDERALL XR) 30 MG 24 hr capsule; Take 1 capsule (30 mg total) by mouth daily.  Generalized anxiety disorder  Episodic mood disorder (HCC) -     ARIPiprazole (ABILIFY) 2 MG tablet; Take 1 tablet (2 mg total) by mouth daily.     Please see After Visit Summary for patient specific instructions.  Future Appointments  Date Time Provider Department Center  03/20/2020  3:00 PM Waldron Session, [facility] CP-[facility] None  03/22/2020  4:40 PM Damari Suastegui, Thereasa Solo, NP CP-CP None    No orders of the defined types were placed in this encounter.   -------------------------------

## 2020-03-13 ENCOUNTER — Telehealth: Payer: Self-pay | Admitting: Adult Health

## 2020-03-13 ENCOUNTER — Other Ambulatory Visit: Payer: Self-pay | Admitting: Adult Health

## 2020-03-13 DIAGNOSIS — F39 Unspecified mood [affective] disorder: Secondary | ICD-10-CM

## 2020-03-13 MED ORDER — ARIPIPRAZOLE 5 MG PO TABS: 5.0000 mg | ORAL_TABLET | Freq: Every day | ORAL | 2 refills | Status: DC

## 2020-03-13 NOTE — Telephone Encounter (Signed)
Pt says that abilify 2 mg that he would like to increase to 4 mg. He said that he has been taking two pills of the 2 mg and he sees a a huge difference. Pt is out so please send new script to the [facility]

## 2020-03-13 NOTE — Telephone Encounter (Signed)
I sent in Abilify 5mg  - insurance will not cover 2 of the 2mg  tablets.

## 2020-03-20 ENCOUNTER — Ambulatory Visit (INDEPENDENT_AMBULATORY_CARE_PROVIDER_SITE_OTHER): Payer: Self-pay | Admitting: Mental Health

## 2020-03-20 ENCOUNTER — Other Ambulatory Visit: Payer: Self-pay

## 2020-03-20 DIAGNOSIS — F39 Unspecified mood [affective] disorder: Secondary | ICD-10-CM
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
## 2020-03-22 ENCOUNTER — Ambulatory Visit (INDEPENDENT_AMBULATORY_CARE_PROVIDER_SITE_OTHER): Payer: Self-pay | Admitting: Adult Health

## 2020-03-22 ENCOUNTER — Other Ambulatory Visit: Payer: Self-pay

## 2020-03-22 ENCOUNTER — Encounter: Payer: Self-pay | Admitting: Adult Health

## 2020-03-22 DIAGNOSIS — F39 Unspecified mood [affective] disorder: Secondary | ICD-10-CM

## 2020-03-22 DIAGNOSIS — F411 Generalized anxiety disorder: Secondary | ICD-10-CM

## 2020-03-22 DIAGNOSIS — F902 Attention-deficit hyperactivity disorder, combined type: Secondary | ICD-10-CM

## 2020-03-22 MED ORDER — AMPHETAMINE-DEXTROAMPHET ER 30 MG PO CP24: 30.0000 mg | ORAL_CAPSULE | Freq: Every day | ORAL | 0 refills | Status: DC

## 2020-03-22 NOTE — Progress Notes (Signed)
Nicholas [name] [id number] October 13, 1985 34 y.o.  Subjective:   Patient ID:  Nicholas [name] is a 33 y.o. (DOB [birth year]) male.  Chief Complaint: No chief complaint on file.   HPI Nicholas [name] presents to the office today for follow-up of ADHD and GAD.  Describes mood today as "ok". Pleasant. Mood symptoms - decreased irritability and anxiety. Denies depression. Stating "I feel happy". Feels like the addition of Abilify has been helpful. Has increased dose from 2mg  to 5mg  daily. Alos feels like the change in the Adderall has been positive. Stable interest and motivation. Taking medications as prescribed.  Energy levels stable. Active, does not have a regular exercise routine. Walking dogs. Enjoys some usual interests and activities. Single. Married - together 5 years. Has a 35 year old son (wife's son) and an 70 month old daughter. Family local. Both sets of parents live in [place]. Spending time with family. Appetite adequate. Weight stable. Sleeps well most nights. Averages 5 to 6 hours.  Focus and concentration improved. Completing tasks. Managing aspects of household. Works as a 4.   Denies SI or HI.  Denies AH or VH.  Previous medication trials: Ritalin, Adderall, Concerta, Stratera  Review of Systems:  Review of Systems  Musculoskeletal: Negative for gait problem.  Neurological: Negative for tremors.  Psychiatric/Behavioral:       Please refer to HPI    Medications: I have reviewed the patient's current medications.  Current Outpatient Medications  Medication Sig Dispense Refill  . amphetamine-dextroamphetamine (ADDERALL XR) 30 MG 24 hr capsule Take 1 capsule (30 mg total) by mouth daily. 30 capsule 0  . [START ON 04/19/2020] amphetamine-dextroamphetamine (ADDERALL XR) 30 MG 24 hr capsule Take 1 capsule (30 mg total) by mouth daily. 30 capsule 0  . ARIPiprazole (ABILIFY) 5 MG tablet Take 1 tablet (5 mg total) by mouth daily. 30 tablet 2  . ibuprofen (ADVIL,MOTRIN)  200 MG tablet Take 1,000 mg by mouth every 6 (six) hours as needed for pain.    Psychologist, occupational oxyCODONE-acetaminophen (ROXICET) 5-325 MG per tablet Take 1-2 tablets by mouth every 4 (four) hours as needed for pain. 30 tablet 0   No current facility-administered medications for this visit.    Medication Side Effects: None  Allergies: No Known Allergies  No past medical history on file.  No family history on file.  Social History   Socioeconomic History  . Marital status: Single    Spouse name: Not on file  . Number of children: Not on file  . Years of education: Not on file  . Highest education level: Not on file  Occupational History  . Not on file  Tobacco Use  . Smoking status: Current Every Day Smoker    Packs/day: 1.00    Years: 6.00    Pack years: 6.00  . Smokeless tobacco: Never Used  Substance and Sexual Activity  . Alcohol use: Yes    Comment: occasionally  . Drug use: Yes    Types: Marijuana    Comment: last used on 12-15-12  . Sexual activity: Yes  Other Topics Concern  . Not on file  Social History Narrative  . Not on file   Social Determinants of Health   Financial Resource Strain:   . Difficulty of Paying Living Expenses: Not on file  Food Insecurity:   . Worried About Marland Kitchen in the Last Year: Not on file  . Ran Out of Food in the Last Year: Not on file  Transportation  Needs:   . Lack of Transportation (Medical): Not on file  . Lack of Transportation (Non-Medical): Not on file  Physical Activity:   . Days of Exercise per Week: Not on file  . Minutes of Exercise per Session: Not on file  Stress:   . Feeling of Stress : Not on file  Social Connections:   . Frequency of Communication with Friends and Family: Not on file  . Frequency of Social Gatherings with Friends and Family: Not on file  . Attends Religious Services: Not on file  . Active Member of Clubs or Organizations: Not on file  . Attends Banker Meetings: Not on file  .  Marital Status: Not on file  Intimate Partner Violence:   . Fear of Current or Ex-Partner: Not on file  . Emotionally Abused: Not on file  . Physically Abused: Not on file  . Sexually Abused: Not on file    Past Medical History, Surgical history, Social history, and Family history were reviewed and updated as appropriate.   Please see review of systems for further details on the patient's review from today.   Objective:   Physical Exam:  There were no vitals taken for this visit.  Physical Exam Constitutional:      General: He is not in acute distress. Musculoskeletal:        General: No deformity.  Neurological:     Mental Status: He is alert and oriented to person, place, and time.     Coordination: Coordination normal.  Psychiatric:        Attention and Perception: Attention and perception normal. He does not perceive auditory or visual hallucinations.        Mood and Affect: Mood normal. Mood is not anxious or depressed. Affect is not labile, blunt, angry or inappropriate.        Speech: Speech normal.        Behavior: Behavior normal.        Thought Content: Thought content normal. Thought content is not paranoid or delusional. Thought content does not include homicidal or suicidal ideation. Thought content does not include homicidal or suicidal plan.        Cognition and Memory: Cognition and memory normal.        Judgment: Judgment normal.     Comments: Insight intact     Lab Review:  No results found for: NA, K, CL, CO2, GLUCOSE, BUN, CREATININE, CALCIUM, PROT, ALBUMIN, AST, ALT, ALKPHOS, BILITOT, GFRNONAA, GFRAA     Component Value Date/Time   WBC 8.7 12/16/2012 1444   RBC 4.96 12/16/2012 1444   HGB 16.0 12/16/2012 1444   HCT 43.5 12/16/2012 1444   PLT 190 12/16/2012 1444   MCV 87.7 12/16/2012 1444   MCH 32.3 12/16/2012 1444   MCHC 36.8 (H) 12/16/2012 1444   RDW 13.2 12/16/2012 1444    No results found for: POCLITH, LITHIUM   No results found for:  PHENYTOIN, PHENOBARB, VALPROATE, CBMZ   .res Assessment: Plan:     Plan:  PDMP reviewed  1. Adderall XR 30mg  every morning 2. Abilify 5mg  daily  [facility] ADHD testing score - 42/58  RTC 4 weeks  Patient advised to contact office with any questions, adverse effects, or acute worsening in signs and symptoms. Discussed potential benefits, risks, and side effects of stimulants with patient to include increased heart rate, palpitations, insomnia, increased anxiety, increased irritability, or decreased appetite.  Instructed patient to contact office if experiencing any significant tolerability issues.  Discussed potential metabolic side effects associated with atypical antipsychotics, as well as potential risk for movement side effects. Advised pt to contact office if movement side effects occur.    Diagnoses and all orders for this visit:  Episodic mood disorder (HCC)  Attention deficit hyperactivity disorder (ADHD), combined type -     amphetamine-dextroamphetamine (ADDERALL XR) 30 MG 24 hr capsule; Take 1 capsule (30 mg total) by mouth daily. -     amphetamine-dextroamphetamine (ADDERALL XR) 30 MG 24 hr capsule; Take 1 capsule (30 mg total) by mouth daily.  Generalized anxiety disorder     Please see After Visit Summary for patient specific instructions.  Future Appointments  Date Time Provider Department Center  04/03/2020  5:00 PM Waldron Session, [facility] None  05/03/2020  4:00 PM Waldron Session, [facility] CP-CP None    No orders of the defined types were placed in this encounter.   -------------------------------

## 2020-04-03 ENCOUNTER — Ambulatory Visit: Payer: Self-pay | Admitting: Mental Health

## 2020-05-03 ENCOUNTER — Ambulatory Visit: Payer: Self-pay | Admitting: Mental Health

## 2020-06-07 ENCOUNTER — Telehealth: Payer: Self-pay | Admitting: Adult Health

## 2020-06-07 ENCOUNTER — Other Ambulatory Visit: Payer: Self-pay

## 2020-06-07 DIAGNOSIS — F902 Attention-deficit hyperactivity disorder, combined type: Secondary | ICD-10-CM

## 2020-06-07 MED ORDER — AMPHETAMINE-DEXTROAMPHET ER 30 MG PO CP24: 30.0000 mg | ORAL_CAPSULE | Freq: Every day | ORAL | 0 refills | Status: AC

## 2020-06-07 NOTE — Telephone Encounter (Signed)
Pt also needs a refill for Adderall sent in at [facility] in [facility].

## 2020-06-07 NOTE — Telephone Encounter (Signed)
Pt is on [employer] and when he took a drug test he tested postive for Methamphetamines. Pt wants to know if he can get a statement emailed to his job that he has a prescription on file for Adderall. Send statement to [email or web address] pt said that his job told him that he has until the end of the day to get statement in or he is fired.

## 2020-06-07 NOTE — Telephone Encounter (Signed)
Beth worked on Physicist, medical and Hydrologist.  Adderall pended, last refill 04/25/20 sent to [facility] to review and send

## 2020-06-07 NOTE — Telephone Encounter (Signed)
Can you send this in?

## 2020-06-20 ENCOUNTER — Ambulatory Visit (INDEPENDENT_AMBULATORY_CARE_PROVIDER_SITE_OTHER): Payer: Self-pay | Admitting: Mental Health

## 2020-06-20 ENCOUNTER — Other Ambulatory Visit: Payer: Self-pay

## 2020-06-20 DIAGNOSIS — F39 Unspecified mood [affective] disorder: Secondary | ICD-10-CM

## 2020-06-20 DIAGNOSIS — F902 Attention-deficit hyperactivity disorder, combined type: Secondary | ICD-10-CM
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
## 2020-06-30 ENCOUNTER — Ambulatory Visit: Payer: Self-pay | Admitting: Adult Health

## 2020-07-11 ENCOUNTER — Other Ambulatory Visit: Payer: Self-pay

## 2020-07-11 ENCOUNTER — Telehealth: Payer: Self-pay | Admitting: Adult Health

## 2020-07-11 DIAGNOSIS — F902 Attention-deficit hyperactivity disorder, combined type: Secondary | ICD-10-CM

## 2020-07-11 DIAGNOSIS — F39 Unspecified mood [affective] disorder: Secondary | ICD-10-CM

## 2020-07-11 MED ORDER — ARIPIPRAZOLE 5 MG PO TABS: 5.0000 mg | ORAL_TABLET | Freq: Every day | ORAL | 2 refills | Status: DC

## 2020-07-11 NOTE — Telephone Encounter (Signed)
Pended Adderal for Almira Coaster to send [place] sent

## 2020-07-11 NOTE — Telephone Encounter (Signed)
Pt called and asked for refill on his adfderall xr 30 mg and his abilify 5 mg to be sent to the [facility] on [address] He has ana ppt on 5/2

## 2020-07-12 MED ORDER — AMPHETAMINE-DEXTROAMPHET ER 30 MG PO CP24: 30.0000 mg | ORAL_CAPSULE | Freq: Every day | ORAL | 0 refills | Status: DC

## 2020-08-02 ENCOUNTER — Ambulatory Visit (INDEPENDENT_AMBULATORY_CARE_PROVIDER_SITE_OTHER): Payer: Self-pay | Admitting: Mental Health

## 2020-08-02 ENCOUNTER — Other Ambulatory Visit: Payer: Self-pay

## 2020-08-02 DIAGNOSIS — F902 Attention-deficit hyperactivity disorder, combined type: Secondary | ICD-10-CM

## 2020-08-02 NOTE — Progress Notes (Signed)
[facility] Psychotherapy Note   Name: Nicholas [name] Date: 08/02/2020 MRN: [id number] DOB: February 12, 1986 PCP: Patient, No Pcp Per (Inactive)  Time spent:  50 minutes  Treatment: individual therapy  Mental Status Exam:    Appearance:    Casual     Behavior:   Appropriate  Motor:   WNL  Speech/Language:    Clear and Coherent  Affect:    Full range  Mood:    Pleasant, intermittent sadness  Thought process:   normal  Thought content:     WNL  Sensory/Perceptual disturbances:     none  Orientation:   x4  Attention:   Good  Concentration:   Good  Memory:   Intact  Fund of knowledge:    Consistent with age and development  Insight:     Good  Judgment:    Good  Impulse Control:   Good    Reported Symptoms:  Impulsivity, irritability, sad feelings  Risk Assessment: Danger to Self:  No Self-injurious Behavior: No Danger to Others: No Duty to Warn:no Physical Aggression / Violence:No  Access to Firearms a concern: No  Gang Involvement:No  Patient / guardian was educated about steps to take if suicide or homicide risk level increases between visits: yes While future psychiatric events cannot be accurately predicted, the patient does not currently require acute inpatient psychiatric care and does not currently meet [facility] involuntary commitment criteria.   Medications: Current Outpatient Medications  Medication Sig Dispense Refill  . amphetamine-dextroamphetamine (ADDERALL XR) 30 MG 24 hr capsule Take 1 capsule (30 mg total) by mouth daily. 30 capsule 0  . amphetamine-dextroamphetamine (ADDERALL XR) 30 MG 24 hr capsule Take 1 capsule (30 mg total) by mouth daily. 30 capsule 0  . ARIPiprazole (ABILIFY) 5 MG tablet Take 1 tablet (5 mg total) by mouth daily. 30 tablet 2  . ibuprofen (ADVIL,MOTRIN) 200 MG tablet Take 1,000 mg by mouth every 6 (six) hours as needed for pain.    Marland Kitchen oxyCODONE-acetaminophen (ROXICET) 5-325 MG per tablet Take 1-2 tablets by mouth every 4  (four) hours as needed for pain. 30 tablet 0   No current facility-administered medications for this visit.    Subjective:  Patient presents for session on time. He shared more family related history, primarily the relational strain between him and his mother. He stay a day of not spoken for the past 2 to 3 years. He said there was a recent family gathering where she was there and he was able to maintain some boundaries with which he feels are needed. He shared his history of being diagnosed ADHD and childhood, his mother not being supportive outside of his taking medications to treat at the time. We plan to further explore this relationship and other factors that affect his self-described challenges with healthy communication and relationships, tendencies toward irritability. Provide support throughout facilitating his identifying areas with which he feels he has made some needed changes in his life where he continues to work on his communication and the relationship with his wife.   Interventions:  Assessment, supportive  Diagnoses:    ICD-10-CM   1. Attention deficit hyperactivity disorder (ADHD), combined type  F90.2      Plan: Patient is to use CBT, mindfulness and coping skills to help manage decrease symptoms.       Long-term goal:  Reduce overall level, frequency, and intensity of the feelings of emotional distress and subsequent behaviors towards improving his relationship w/ his wife and feeling an increased  sense of self acceptance.   Short-term goal:    Decrease self described narcissistic traits- be more accountable for his behaviors Start to show affection and increase comfort in expressing w/ his wife, work on being more physically affectionate  Decrease identifying himself as a victim when he and his wife disagree, "I tend to make a bigger deal about issues than what they really are" Improve communication w/ his wife, stop yelling when uspet  Assessment of progress:   progressing    Waldron Session, [facility]

## 2020-08-14 ENCOUNTER — Ambulatory Visit: Payer: Self-pay | Admitting: Adult Health

## 2020-08-14 ENCOUNTER — Telehealth: Payer: Self-pay | Admitting: Adult Health

## 2020-08-14 NOTE — Telephone Encounter (Signed)
Andron requested refill for Adderall 30mg . He has appt 5/23. Ph: [phone]. Pharmacy 6/23 [address] [place]

## 2020-08-15 ENCOUNTER — Other Ambulatory Visit: Payer: Self-pay | Admitting: Adult Health

## 2020-08-15 DIAGNOSIS — F902 Attention-deficit hyperactivity disorder, combined type: Secondary | ICD-10-CM

## 2020-08-15 MED ORDER — AMPHETAMINE-DEXTROAMPHET ER 30 MG PO CP24: 30.0000 mg | ORAL_CAPSULE | Freq: Every day | ORAL | 0 refills | Status: DC

## 2020-08-15 NOTE — Telephone Encounter (Signed)
Script sent  

## 2020-09-04 ENCOUNTER — Ambulatory Visit (INDEPENDENT_AMBULATORY_CARE_PROVIDER_SITE_OTHER): Payer: Self-pay | Admitting: Adult Health

## 2020-09-04 ENCOUNTER — Other Ambulatory Visit: Payer: Self-pay

## 2020-09-04 ENCOUNTER — Encounter: Payer: Self-pay | Admitting: Adult Health

## 2020-09-04 DIAGNOSIS — F331 Major depressive disorder, recurrent, moderate: Secondary | ICD-10-CM

## 2020-09-04 DIAGNOSIS — F411 Generalized anxiety disorder: Secondary | ICD-10-CM

## 2020-09-04 DIAGNOSIS — F902 Attention-deficit hyperactivity disorder, combined type: Secondary | ICD-10-CM

## 2020-09-04 MED ORDER — AMPHETAMINE-DEXTROAMPHET ER 30 MG PO CP24: 30.0000 mg | ORAL_CAPSULE | Freq: Every day | ORAL | 0 refills | Status: AC

## 2020-09-04 MED ORDER — BUPROPION HCL ER (XL) 150 MG PO TB24: ORAL_TABLET | ORAL | 5 refills | Status: AC

## 2020-09-04 NOTE — Progress Notes (Signed)
Nicholas [name] [id number] 09/04/1985 35 y.o.  Subjective:   Patient ID:  Nicholas [name] is a 35 y.o. (DOB Jun 14, 1985) male.  Chief Complaint: No chief complaint on file.   HPI Nicholas [name] presents to the office today for follow-up of ADHD, MDD and GAD.  Describes mood today as "ok". Pleasant. Mood symptoms - decreased irritability and anxiety. Reports depression - "I feel apathetic - fatigued". Stating "I don't feel happy". Stopped the Abilify - "it helped with some things". Willing to try another medication. Stable interest and motivation. Taking medications as prescribed.  Energy levels stable. Active, does not have a regular exercise routine. Walking dogs. Enjoys some usual interests and activities. Single. Married - together 5 years. Has a 55 year old son (wife's son) and an 75 month old daughter. Family local. Both sets of parents live in [place]. Spending time with family. Appetite adequate. Weight stable. Sleeps well most nights. Averages 5 to 6 hours.  Focus and concentration improved. Completing tasks. Managing aspects of household. Works as a Psychologist, occupational.   Denies SI or HI.  Denies AH or VH.  Previous medication trials: Ritalin, Adderall, Concerta, Stratera   Review of Systems:  Review of Systems  Musculoskeletal: Negative for gait problem.  Neurological: Negative for tremors.  Psychiatric/Behavioral:       Please refer to HPI    Medications: I have reviewed the patient's current medications.  Current Outpatient Medications  Medication Sig Dispense Refill  . buPROPion (WELLBUTRIN XL) 150 MG 24 hr tablet Take one tablet every morning for 7 days, then take two tablets every morning. 60 tablet 5  . amphetamine-dextroamphetamine (ADDERALL XR) 30 MG 24 hr capsule Take 1 capsule (30 mg total) by mouth daily. 30 capsule 0  . amphetamine-dextroamphetamine (ADDERALL XR) 30 MG 24 hr capsule Take 1 capsule (30 mg total) by mouth daily. 30 capsule 0  . ibuprofen  (ADVIL,MOTRIN) 200 MG tablet Take 1,000 mg by mouth every 6 (six) hours as needed for pain.    Marland Kitchen oxyCODONE-acetaminophen (ROXICET) 5-325 MG per tablet Take 1-2 tablets by mouth every 4 (four) hours as needed for pain. 30 tablet 0   No current facility-administered medications for this visit.    Medication Side Effects: None  Allergies: No Known Allergies  No past medical history on file.  Past Medical History, Surgical history, Social history, and Family history were reviewed and updated as appropriate.   Please see review of systems for further details on the patient's review from today.   Objective:   Physical Exam:  There were no vitals taken for this visit.  Physical Exam Constitutional:      General: He is not in acute distress. Musculoskeletal:        General: No deformity.  Neurological:     Mental Status: He is alert and oriented to person, place, and time.     Coordination: Coordination normal.  Psychiatric:        Attention and Perception: Attention and perception normal. He does not perceive auditory or visual hallucinations.        Mood and Affect: Mood normal. Mood is not anxious or depressed. Affect is not labile, blunt, angry or inappropriate.        Speech: Speech normal.        Behavior: Behavior normal.        Thought Content: Thought content normal. Thought content is not paranoid or delusional. Thought content does not include homicidal or suicidal ideation. Thought content does not  include homicidal or suicidal plan.        Cognition and Memory: Cognition and memory normal.        Judgment: Judgment normal.     Comments: Insight intact     Lab Review:  No results found for: NA, K, CL, CO2, GLUCOSE, BUN, CREATININE, CALCIUM, PROT, ALBUMIN, AST, ALT, ALKPHOS, BILITOT, GFRNONAA, GFRAA     Component Value Date/Time   WBC 8.7 12/16/2012 1444   RBC 4.96 12/16/2012 1444   HGB 16.0 12/16/2012 1444   HCT 43.5 12/16/2012 1444   PLT 190 12/16/2012 1444    MCV 87.7 12/16/2012 1444   MCH 32.3 12/16/2012 1444   MCHC 36.8 (H) 12/16/2012 1444   RDW 13.2 12/16/2012 1444    No results found for: POCLITH, LITHIUM   No results found for: PHENYTOIN, PHENOBARB, VALPROATE, CBMZ   .res Assessment: Plan:    Plan:  PDMP reviewed.  120/73 - 93  1. Adderall XR 30mg  every morning 2. D/C Abilify 5mg  daily 3. Add Wellbutrin XL 150mg  daily x 7 days, then increase to 300mg  daily. Denies seizure history.   [facility] ADHD testing score - 42/58  RTC 4 weeks  Patient advised to contact office with any questions, adverse effects, or acute worsening in signs and symptoms. Discussed potential benefits, risks, and side effects of stimulants with patient to include increased heart rate, palpitations, insomnia, increased anxiety, increased irritability, or decreased appetite.  Instructed patient to contact office if experiencing any significant tolerability issues.  Discussed potential metabolic side effects associated with atypical antipsychotics, as well as potential risk for movement side effects. Advised pt to contact office if movement side effects occur.    Diagnoses and all orders for this visit:  Generalized anxiety disorder -     buPROPion (WELLBUTRIN XL) 150 MG 24 hr tablet; Take one tablet every morning for 7 days, then take two tablets every morning.  Attention deficit hyperactivity disorder (ADHD), combined type -     amphetamine-dextroamphetamine (ADDERALL XR) 30 MG 24 hr capsule; Take 1 capsule (30 mg total) by mouth daily.  Major depressive disorder, recurrent episode, moderate (HCC) -     buPROPion (WELLBUTRIN XL) 150 MG 24 hr tablet; Take one tablet every morning for 7 days, then take two tablets every morning.     Please see After Visit Summary for patient specific instructions.  Future Appointments  Date Time Provider Department Center  10/02/2020  5:00 PM Prescilla Monger, , NP [facility]-CP None    No orders of the defined  types were placed in this encounter.   -------------------------------

## 2020-10-02 ENCOUNTER — Ambulatory Visit: Payer: Self-pay | Admitting: Adult Health

## 2020-10-05 ENCOUNTER — Ambulatory Visit: Payer: Self-pay | Admitting: Adult Health

## 2021-02-03 ENCOUNTER — Other Ambulatory Visit: Payer: Self-pay

## 2021-02-03 ENCOUNTER — Emergency Department (HOSPITAL_COMMUNITY): Admission: EM | Admit: 2021-02-03 | Discharge: 2021-02-04 | Discharge status: Against Medical Advice | Payer: Self-pay

## 2021-07-31 ENCOUNTER — Other Ambulatory Visit: Payer: Self-pay

## 2021-07-31 ENCOUNTER — Emergency Department (HOSPITAL_COMMUNITY)
Admission: EM | Admit: 2021-07-31 | Discharge: 2021-07-31 | Disposition: A | Discharge status: Home / Self Care | Payer: Medicaid Other | Attending: Emergency Medicine | Admitting: Emergency Medicine

## 2021-07-31 ENCOUNTER — Encounter (HOSPITAL_COMMUNITY): Payer: Self-pay | Admitting: Emergency Medicine

## 2021-07-31 DIAGNOSIS — F419 Anxiety disorder, unspecified: Secondary | ICD-10-CM | POA: Insufficient documentation

## 2021-07-31 NOTE — Discharge Instructions (Addendum)
Please report to the behavioral health urgent care.  The address is [address] [place], [place] [address]. The urgent care for mental health concerns is downstairs.  ?Please read attached informational guides concerning ways to manage anxiety ?In the event that [facility] is unable to assist you, please return to ED for further evaluation and management  ?
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
## 2021-07-31 NOTE — Progress Notes (Signed)
?   07/31/21 2256  ?[facility] Triage Screening (Walk-ins at [facility] only)  ?How Did You Hear About [place]? Self  ?What Is the Reason for Your Visit/Call Today? Pt reports as a walk in to [facility] for evaluation of anxiety/stress. Pt is accompanied by his sister, who often answers questions for him (and pt requested that she be brought back with him for Triage). Pt reports that he is under a lot of stress--pt states that he has some family conflict going on and "untreated mental illness".  Pt states "I can't function as a person".  Pt states that he went to [facility] earlier today and was referred back to [facility].  Pt states that he is being treated by [facility] and is currently taking Adderall and abilify. Pt states he was taking Wellbutrin but stopped taking it.  Pt denies current SI, HI, AVH, or substance use. Pt does not feel he is a danger to himself and pts sister does not feel he is currently a danger to himself.  ?How Long Has This Been Causing You Problems? <Week  ?Have You Recently Had Any Thoughts About Hurting Yourself? No  ?Are You Planning to Commit Suicide/Harm Yourself At This time? No  ?Have you Recently Had Thoughts About Hurting Someone Karolee Ohs? No  ?Are You Planning To Harm Someone At This Time? No  ?Are you currently experiencing any auditory, visual or other hallucinations? No  ?Have You Used Any Alcohol or Drugs in the Past 24 Hours? No  ?Do you have any current medical co-morbidities that require immediate attention? No  ?What Do You Feel Would Help You the Most Today? Treatment for Depression or other mood problem  ?If access to [facility] was not available, would you have sought care in the Emergency Department? Yes  ?Determination of Need Routine (7 days)  ?Options For Referral Medication Management;Outpatient Therapy  ? ? ?

## 2021-07-31 NOTE — ED Triage Notes (Signed)
Pt arrived via POV. Pt reports anxiety and alcohol abuse. Pt is crying and upset, stating that he feels helpless. Pt denies SI or HI. ?

## 2021-08-01 ENCOUNTER — Ambulatory Visit (HOSPITAL_COMMUNITY)
Admission: EM | Admit: 2021-08-01 | Discharge: 2021-08-01 | Discharge status: Against Medical Advice | Payer: No Payment, Other

## 2021-08-01 ENCOUNTER — Ambulatory Visit (HOSPITAL_COMMUNITY)
Admission: EM | Admit: 2021-08-01 | Discharge: 2021-08-01 | Disposition: A | Discharge status: Home / Self Care | Payer: No Payment, Other | Attending: Psychiatry | Admitting: Psychiatry

## 2021-08-01 DIAGNOSIS — F1290 Cannabis use, unspecified, uncomplicated: Secondary | ICD-10-CM | POA: Insufficient documentation

## 2021-08-01 DIAGNOSIS — F1090 Alcohol use, unspecified, uncomplicated: Secondary | ICD-10-CM | POA: Insufficient documentation

## 2021-08-01 DIAGNOSIS — F909 Attention-deficit hyperactivity disorder, unspecified type: Secondary | ICD-10-CM | POA: Insufficient documentation

## 2021-08-01 DIAGNOSIS — Z9151 Personal history of suicidal behavior: Secondary | ICD-10-CM | POA: Insufficient documentation

## 2021-08-01 DIAGNOSIS — Z79899 Other long term (current) drug therapy: Secondary | ICD-10-CM | POA: Insufficient documentation

## 2021-08-01 DIAGNOSIS — F4323 Adjustment disorder with mixed anxiety and depressed mood: Secondary | ICD-10-CM | POA: Insufficient documentation

## 2021-08-01 NOTE — Discharge Instructions (Addendum)

## 2021-08-01 NOTE — ED Provider Notes (Signed)
Behavioral Health Urgent Care Medical Screening Exam ? ?Patient Name: Nicholas [name] ?MRN: [id number] ?Date of Evaluation: 08/01/21 ?Chief Complaint:   ?Diagnosis:  ?Final diagnoses:  ?Adjustment disorder with mixed anxiety and depressed mood  ? ? ?History of Present illness: Nicholas [name] is a 36 y.o. male. Patient presents voluntarily to [facility] behavioral health for walk-in assessment.   ? ?[name] reports he is seeking outpatient psychiatry resources today. He is assessed face-to-face by nurse practitioner.  He is seated in assessment area, no acute distress.  He is alert and oriented, pleasant and cooperative during assessment.  ? ?He presents with depressed mood, tearful affect.  He denies suicidal and homicidal ideations.  He denies history of nonsuicidal self-harm behavior.  He contracts verbally for safety with this Clinical research associate. ? ?Recent stressors include a separation from his wife in February 2023.  Additional stressors include anger outburst.  He reports on Sunday he had an argument, verbally, with his sister and his wife and "peeled out of there" in his car.  He reports this argument stemmed from his wife "texting one of her boyfriends." ? ?[name] endorses history of 1 previous suicide attempt, at age 51.  No history of inpatient psychiatric hospitalizations.  He has been diagnosed with ADHD and generalized anxiety disorder.  He has been followed, most recently, at [facility] in [place], by Dr [name].  Medications included Abilify and Adderall.  He was briefly trialed on Wellbutrin, this medication was not effective.  He currently does not have the financial ability to continue with outpatient psychiatry at [facility] psychiatric group.  Last dose of Abilify and Adderall approximately 6 months ago.  He is linked with outpatient therapy through [facility].  Meets with virtual therapist weekly.  Believes therapist is therapeutic and plans to continue this relationship.  No  family mental health history reported. ? ?He has normal speech and behavior. He denies auditory and visual hallucinations.  Patient is able to converse coherently with goal-directed thoughts and no distractibility or preoccupation.  He denies paranoia.  Objectively there is no evidence of psychosis/mania or delusional thinking. ? ?[name] currently resides in [place] with a friend.  He has visitation with his children, 28-year-old son and a 32-year-old daughter, periodically.  He denies access to weapons.  He is employed in the [employer].  Patient endorses average sleep and decreased appetite.  He endorses marijuana use, approximately 3 times per week.  He also endorses alcohol use.  Typically consumes 2 twelve ounce beers approximately 3 times per week.  He denies blackouts or seizures related to alcohol.  He denies substance use aside from alcohol and marijuana. ? ?Patient offered support and encouragement.  He gives verbal consent to speak with his sister-in-law identifies her as his primary emotional support, [name] [phone] HIPPA compliant voicemail left ? ?Psychiatric Specialty Exam ? ?Presentation  ?General Appearance:Appropriate for Environment; Casual ? ?Eye Contact:Good ? ?Speech:Clear and Coherent; Normal Rate ? ?Speech Volume:Normal ? ?Handedness:Right ? ? ?Mood and Affect  ?Mood:Depressed ? ?Affect:Tearful; Depressed ? ? ?Thought Process  ?Thought Processes:Coherent; Goal Directed; Linear ? ?Descriptions of Associations:Intact ? ?Orientation:No data recorded ?Thought Content:Logical; WDL ?   Hallucinations:None ? ?Ideas of Reference:None ? ?Suicidal Thoughts:No ? ?Homicidal Thoughts:No ? ? ?Sensorium  ?Memory:Immediate Good; Recent Good ? ?Judgment:Good ? ?Insight:Fair ? ? ?Executive Functions  ?Concentration:Good ? ?Attention Span:Good ? ?Recall:Good ? ?Fund of Knowledge:Good ? ?Language:Good ? ? ?Psychomotor Activity  ?Psychomotor Activity:Normal ? ? ?Assets   ?Assets:Communication Skills; Desire for Improvement;  Financial Resources/Insurance; Housing; Intimacy; Leisure Time; Resilience; Physical Health; Social Support ? ? ?Sleep  ?Sleep:Fair ? ?Number of hours: No data recorded ? ?No data recorded ? ?Physical Exam: ?Physical Exam ?Vitals and nursing note reviewed.  ?Constitutional:   ?   Appearance: Normal appearance. He is well-developed and normal weight.  ?HENT:  ?   Head: Normocephalic and atraumatic.  ?   Nose: Nose normal.  ?Cardiovascular:  ?   Rate and Rhythm: Normal rate.  ?Pulmonary:  ?   Effort: Pulmonary effort is normal.  ?Musculoskeletal:     ?   General: Normal range of motion.  ?   Cervical back: Normal range of motion.  ?Skin: ?   General: Skin is warm and dry.  ?Neurological:  ?   Mental Status: He is alert and oriented to person, place, and time.  ?Psychiatric:     ?   Attention and Perception: Attention and perception normal.     ?   Mood and Affect: Mood is depressed. Affect is tearful.     ?   Speech: Speech normal.     ?   Behavior: Behavior normal. Behavior is cooperative.     ?   Thought Content: Thought content normal.     ?   Cognition and Memory: Cognition and memory normal.     ?   Judgment: Judgment normal.  ? ?Review of Systems  ?Constitutional: Negative.   ?HENT: Negative.    ?Eyes: Negative.   ?Respiratory: Negative.    ?Cardiovascular: Negative.   ?Gastrointestinal: Negative.   ?Genitourinary: Negative.   ?Musculoskeletal: Negative.   ?Skin: Negative.   ?Neurological: Negative.   ?Endo/Heme/Allergies: Negative.   ?Psychiatric/Behavioral:  Positive for depression.   ?Blood pressure (!) 135/95, pulse 92, temperature 99.2 ?F (37.3 ?C), temperature source Oral, resp. rate 18, SpO2 98 %. There is no height or weight on file to calculate BMI. ? ?Musculoskeletal: ?Strength & Muscle Tone: within normal limits ?Gait & Station: normal ?Patient leans: N/A ? ? ?[facility] MSE Discharge Disposition for Follow up and Recommendations: ?Based on my  evaluation the patient does not appear to have an emergency medical condition and can be discharged with resources and follow up care in outpatient services for Medication Management and Individual Therapy ?Patient reviewed with Dr. Bronwen Betters. ?Follow-up with outpatient psychiatry, resources provided. ? ? ?Lenard Lance, FNP ?08/01/2021, 11:07 AM ? ?

## 2021-08-01 NOTE — ED Notes (Signed)
Pt states he can no longer wait and will return in the morning. Left without being seen by provider after being triaged.  ?

## 2021-08-01 NOTE — Progress Notes (Signed)
?   08/01/21 1012  ?[place] Triage Screening (Walk-ins at [facility] only)  ?What Is the Reason for Your Visit/Call Today? 36 year old Nicholas [name] present to [facility] with complaints of extreme anxiety and stress which is causing him to have difficult relationships with everyone in his life. Report his anxiety attacks triggers behavioral problems such yelling out of control. He reports it's scary because he ' blackout.' Report every 3 weeks or so he just losing it. Report he feels that he may have Bipolar. Denied suicidal/homicidal and auditory/visual hallucinations. Chart review patient seen on 07/31/2021, "Pt reports as a walk in to [facility] for evaluation of anxiety/stress. Pt is accompanied by his sister, who often answers questions for him (and pt requested that she be brought back with him for Triage). Pt reports that he is under a lot of stress--pt states that he has some family conflict going on and "untreated mental illness".  Pt states "I can't function as a person".  Pt states that he went to [facility] earlier today and was referred back to [facility].  Pt states that he is being treated by [facility] and is currently taking Adderall and abilify. Pt states he was taking Wellbutrin but stopped taking it.  Pt denies current SI, HI, AVH, or substance use. Pt does not feel he is a danger to himself and pts sister does not feel he is currently a danger to himself." Reported he left last night because he was really tired and decided to return this morning. Patient does not have a psychiatric (last seen at [facility] 17-months ago). Report he has a therapist with [facility]. Report he requesting assistance for medication management services.  ?How Long Has This Been Causing You Problems? <Week  ?Have You Recently Had Any Thoughts About Hurting Yourself? No  ?Are You Planning to Commit Suicide/Harm Yourself At This time? No  ?Have you Recently Had Thoughts About [place]? No  ?Are You Planning To Harm Someone At This Time? No   ?Are you currently experiencing any auditory, visual or other hallucinations? No  ?Have You Used Any Alcohol or Drugs in the Past 24 Hours? No  ?Do you have any current medical co-morbidities that require immediate attention? No  ?Clinician description of patient physical appearance/behavior: Patient dressed appropriately for the weather. He's cooperative and pleasant.  ?What Do You Feel Would Help You the Most Today? Treatment for Depression or other mood problem  ?If access to [facility] was not available, would you have sought care in the Emergency Department? Yes  ?Determination of Need Routine (7 days)  ?Options For Referral Medication Management;Outpatient Therapy  ? ? ?

## 2021-08-10 ENCOUNTER — Ambulatory Visit (INDEPENDENT_AMBULATORY_CARE_PROVIDER_SITE_OTHER): Payer: No Payment, Other | Admitting: Physician Assistant

## 2021-08-10 ENCOUNTER — Encounter (HOSPITAL_COMMUNITY): Payer: Self-pay | Admitting: Physician Assistant

## 2021-08-10 ENCOUNTER — Other Ambulatory Visit: Payer: Self-pay

## 2021-08-10 VITALS — BP 138/88 | HR 68 | Ht 72.0 in | Wt 167.0 lb

## 2021-08-10 DIAGNOSIS — F902 Attention-deficit hyperactivity disorder, combined type: Secondary | ICD-10-CM

## 2021-08-10 DIAGNOSIS — F5105 Insomnia due to other mental disorder: Secondary | ICD-10-CM | POA: Diagnosis not present

## 2021-08-10 DIAGNOSIS — F39 Unspecified mood [affective] disorder: Secondary | ICD-10-CM

## 2021-08-10 DIAGNOSIS — F411 Generalized anxiety disorder: Secondary | ICD-10-CM | POA: Diagnosis not present

## 2021-08-10 DIAGNOSIS — F99 Mental disorder, not otherwise specified: Secondary | ICD-10-CM

## 2021-08-10 MED ORDER — TRAZODONE HCL 50 MG PO TABS
50.0000 mg | ORAL_TABLET | Freq: Every day | ORAL | 1 refills | Status: DC
  Filled 2021-08-10: qty 30, 30d supply, fill #0

## 2021-08-10 MED ORDER — HYDROXYZINE HCL 10 MG PO TABS
10.0000 mg | ORAL_TABLET | Freq: Three times a day (TID) | ORAL | 1 refills | Status: AC | PRN
  Filled 2021-08-10: qty 75, 25d supply, fill #0

## 2021-08-10 MED ORDER — ARIPIPRAZOLE 5 MG PO TABS
5.0000 mg | ORAL_TABLET | Freq: Every day | ORAL | 1 refills | Status: DC
  Filled 2021-08-10: qty 30, 30d supply, fill #0

## 2021-08-10 MED ORDER — ATOMOXETINE HCL 40 MG PO CAPS
40.0000 mg | ORAL_CAPSULE | Freq: Every day | ORAL | 1 refills | Status: DC
  Filled 2021-08-10: qty 30, 30d supply, fill #0
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
## 2021-08-13 ENCOUNTER — Encounter (HOSPITAL_COMMUNITY): Payer: Self-pay | Admitting: Physician Assistant

## 2021-09-14 ENCOUNTER — Other Ambulatory Visit: Payer: Self-pay

## 2021-09-14 ENCOUNTER — Ambulatory Visit (INDEPENDENT_AMBULATORY_CARE_PROVIDER_SITE_OTHER): Payer: No Payment, Other | Admitting: Physician Assistant

## 2021-09-14 DIAGNOSIS — F39 Unspecified mood [affective] disorder: Secondary | ICD-10-CM | POA: Diagnosis not present

## 2021-09-14 DIAGNOSIS — F902 Attention-deficit hyperactivity disorder, combined type: Secondary | ICD-10-CM | POA: Diagnosis not present

## 2021-09-14 MED ORDER — ATOMOXETINE HCL 60 MG PO CAPS
60.0000 mg | ORAL_CAPSULE | Freq: Every day | ORAL | 2 refills | Status: AC
  Filled 2021-09-14 – 2021-09-24 (×2): qty 30, 30d supply, fill #0

## 2021-09-14 MED ORDER — ARIPIPRAZOLE 15 MG PO TABS
7.5000 mg | ORAL_TABLET | Freq: Every day | ORAL | 2 refills | Status: AC
  Filled 2021-09-14 – 2021-09-24 (×2): qty 15, 30d supply, fill #0

## 2021-09-14 NOTE — Progress Notes (Addendum)
BH MD/PA/NP OP Progress Note  09/17/2021 11:31 PM Nicholas [name]  MRN:  [id number]  Chief Complaint:  Chief Complaint  Patient presents with   Follow-up   Medication Management   HPI:   Nicholas [name] is a 36 year old male with a past psychiatric significant for attention deficit hyperactivity disorder (combined type), generalized anxiety disorder, episodic mood disorder, and insomnia who presents to [facility] for follow-up and medication management.  Patient is currently being managed on the following medications:  Trazodone 50 mg at bedtime Hydroxyzine 10 mg 3 times daily as needed Atomoxetine 40 mg daily Abilify 5 mg daily  Patient reports that his use of Abilify and Strattera has been going well.  He reports that he has not been taking his hydroxyzine as much for the management of his anxiety.  He states that his hydroxyzine has been helpful with his sleep but still endorses feeling groggy upon waking.  With the use of Strattera, patient states that he feels more focus and experience less ADHD symptoms.  Since taking Abilify, he states that the medication has been helpful with his depression and that he has been experiencing mild depressive symptoms.  He has noticed a huge difference in his mood and states that he does not experience outbursts or lashing out.  Patient states that people in his circle have noticed positive differences in his mood.  Although the patient expresses occasionally experiencing general sadness, he reports feeling more clear minded due to the use of his Strattera and Abilify.  Patient states that his trazodone has not been helpful in the management of his sleep.  He reports experiencing waking up a couple of times in the night.  He reports that his anxiety has improved and has been more manageable.  Patient denies any new stressors at this time and states that things are going pretty smoothly.  A PHQ-9 screen was  performed with the patient scoring a 13.  A GAD-7 screen was also performed with the patient scoring a 9.  Patient is alert and oriented x4, pleasant, calm, cooperative, and fully engaged in conversation during the encounter.  Patient states that his mood has greatly improved but still occasionally experiences lethargy at times.  Patient endorses laughing more and has become more interested in things that he used to like going.  Patient denies suicidal or homicidal ideations.  He further denies auditory or visual hallucinations and does not appear to be responding to internal/external stimuli.  Patient endorses fair sleep and receives on average 6 hours of sleep each night.  Patient notes that he has been eating worse due to having a better appetite.  Patient endorses eating on average 2-3 meals per day.  Patient endorses alcohol consumption socially and has roughly 3-4 beers per week.  Patient endorses tobacco use and smokes on average less than a pack per day.  Patient endorses illicit drug use in the form of marijuana.  Visit Diagnosis:    ICD-10-CM   1. Attention deficit hyperactivity disorder (ADHD), combined type  F90.2 atomoxetine (STRATTERA) 60 MG capsule    2. Episodic mood disorder (HCC)  F39 ARIPiprazole (ABILIFY) 15 MG tablet      Past Psychiatric History:  Attention deficit hyperactivity disorder (combined type) Episodic mood disorder Generalized anxiety disorder Insomnia  Past Medical History: History reviewed. No pertinent past medical history.  Past Surgical History:  Procedure Laterality Date   ORIF TRIPOD FRACTURE Left 12/16/2012   Procedure: OPEN REDUCTION INTERNAL FIXATION (  ORIF) TRIPOD FRACTURE with multiple approaches;  Surgeon: Darletta MollSui W Teoh, MD;  [facility];  Service: ENT;  Laterality: Left;    Family Psychiatric History:  Mother - ADHD  Family History: History reviewed. No pertinent family history.  Social History:  Social History   Socioeconomic History    Marital status: Married    Spouse name: Not on file   Number of children: Not on file   Years of education: Not on file   Highest education level: Not on file  Occupational History   Not on file  Tobacco Use   Smoking status: Every Day    Packs/day: 1.00    Years: 6.00    Pack years: 6.00    Types: Cigarettes   Smokeless tobacco: Never  Substance and Sexual Activity   Alcohol use: Yes    Comment: occasionally   Drug use: Yes    Types: Marijuana    Comment: last used on 12-15-12   Sexual activity: Yes  Other Topics Concern   Not on file  Social History Narrative   Not on file   Social Determinants of Health   Financial Resource Strain: Not on file  Food Insecurity: Not on file  Transportation Needs: Not on file  Physical Activity: Not on file  Stress: Not on file  Social Connections: Not on file    Allergies: No Known Allergies  Metabolic Disorder Labs: No results found for: HGBA1C, MPG No results found for: PROLACTIN No results found for: CHOL, TRIG, HDL, CHOLHDL, VLDL, LDLCALC No results found for: TSH  Therapeutic Level Labs: No results found for: LITHIUM No results found for: VALPROATE No components found for:  CBMZ  Current Medications: Current Outpatient Medications  Medication Sig Dispense Refill   amphetamine-dextroamphetamine (ADDERALL XR) 30 MG 24 hr capsule Take 1 capsule (30 mg total) by mouth daily. 30 capsule 0   amphetamine-dextroamphetamine (ADDERALL XR) 30 MG 24 hr capsule Take 1 capsule (30 mg total) by mouth daily. 30 capsule 0   ARIPiprazole (ABILIFY) 15 MG tablet Take 0.5 tablets (7.5 mg total) by mouth daily. 15 tablet 2   atomoxetine (STRATTERA) 60 MG capsule Take 1 capsule (60 mg total) by mouth daily. 30 capsule 2   buPROPion (WELLBUTRIN XL) 150 MG 24 hr tablet Take one tablet every morning for 7 days, then take two tablets every morning. 60 tablet 5   hydrOXYzine (ATARAX) 10 MG tablet Take 1 tablet (10 mg total) by mouth 3 (three) times  daily as needed. 75 tablet 1   ibuprofen (ADVIL,MOTRIN) 200 MG tablet Take 1,000 mg by mouth every 6 (six) hours as needed for pain.     oxyCODONE-acetaminophen (ROXICET) 5-325 MG per tablet Take 1-2 tablets by mouth every 4 (four) hours as needed for pain. 30 tablet 0   No current facility-administered medications for this visit.     Musculoskeletal: Strength & Muscle Tone: within normal limits Gait & Station: normal Patient leans: N/A  Psychiatric Specialty Exam: Review of Systems  Psychiatric/Behavioral:  Positive for sleep disturbance. Negative for decreased concentration, dysphoric mood, hallucinations, self-injury and suicidal ideas. The patient is nervous/anxious. The patient is not hyperactive.    There were no vitals taken for this visit.There is no height or weight on file to calculate BMI.  General Appearance: Well Groomed  Eye Contact:  Good  Speech:  Clear and Coherent and Normal Rate  Volume:  Normal  Mood:  Anxious and Euthymic  Affect:  Appropriate and Congruent  Thought Process:  Coherent, Goal Directed, and Descriptions of Associations: Intact  Orientation:  Full (Time, Place, and Person)  Thought Content: WDL   Suicidal Thoughts:  No  Homicidal Thoughts:  No  Memory:  Immediate;   Good Recent;   Good Remote;   Good  Judgement:  Good  Insight:  Good  Psychomotor Activity:  Normal  Concentration:  Concentration: Good and Attention Span: Good  Recall:  Good  Fund of Knowledge: Good  Language: Good  Akathisia:  No  Handed:  Left  AIMS (if indicated): not done  Assets:  Communication Skills Desire for Improvement Financial Resources/Insurance Housing Social Support Talents/Skills Vocational/Educational  ADL's:  Intact  Cognition: WNL  Sleep:  Fair   Screenings: GAD-7    Flowsheet Row Clinical Support from 09/14/2021 in [facility] Office Visit from 08/10/2021 in [facility]  Total GAD-7 Score  9 13      PHQ2-9    Flowsheet Row Clinical Support from 09/14/2021 in [facility] Office Visit from 08/10/2021 in [place]  PHQ-2 Total Score 2 4  PHQ-9 Total Score 13 17      Flowsheet Row Clinical Support from 09/14/2021 in [facility] Office Visit from 08/10/2021 in [facility] ED from 07/31/2021 in [facility]  C-SSRS RISK CATEGORY Low Risk Low Risk No Risk        Assessment and Plan:   Kavan A. Guzzo is a 36 year old male with a past psychiatric significant for attention deficit hyperactivity disorder (combined type), generalized anxiety disorder, episodic mood disorder, and insomnia who presents to [facility] for follow-up and medication management.  Patient reports that his mood and ADHD symptoms have greatly improved since using Strattera and Abilify.  Patient occasionally endorses some general sadness from time to time.  He reports that use of trazodone has not been helpful in the management of his sleep but will like to continue taking hydroxyzine since the medication has proven beneficial for sleep.  Patient is interested in increasing his dosage of Abilify and Strattera for the management of his ADHD symptoms and depressive symptoms.  Provider recommended increasing patient's Abilify dosage from 40 mg to 60 mg daily as well as increasing his Abilify dosage from 5 mg to 7.5 mg daily for the management of his lingering symptoms.  Patient was agreeable to recommendations.  Patient's medications to be e-prescribed to pharmacy of choice.  Collaboration of Care: Collaboration of Care: Medication Management AEB provider managing patient's psychiatric medications and Psychiatrist AEB patient being followed by a psychiatric provider  Patient/Guardian was advised Release of Information must be obtained  prior to any record release in order to collaborate their care with an outside provider. Patient/Guardian was advised if they have not already done so to contact the registration department to sign all necessary forms in order for [place] to release information regarding their care.   Consent: Patient/Guardian gives verbal consent for treatment and assignment of benefits for services provided during this visit. Patient/Guardian expressed understanding and agreed to proceed.   1. Attention deficit hyperactivity disorder (ADHD), combined type  - atomoxetine (STRATTERA) 60 MG capsule; Take 1 capsule (60 mg total) by mouth daily.  Dispense: 30 capsule; Refill: 2  2. Episodic mood disorder (HCC)  - ARIPiprazole (ABILIFY) 15 MG tablet; Take 0.5 tablets (7.5 mg total) by mouth daily.  Dispense: 15 tablet; Refill: 2  Patient  to follow up in 7 weeks Provider spent a total of 14 minutes with the patient/reviewing patient's chart  Meta Hatchet, PA 09/17/2021, 11:31 PM

## 2021-09-17 ENCOUNTER — Encounter (HOSPITAL_COMMUNITY): Payer: Self-pay | Admitting: Physician Assistant

## 2021-09-21 ENCOUNTER — Other Ambulatory Visit: Payer: Self-pay

## 2021-09-24 ENCOUNTER — Other Ambulatory Visit: Payer: Self-pay

## 2021-09-26 ENCOUNTER — Encounter (HOSPITAL_COMMUNITY): Payer: No Payment, Other | Admitting: Physician Assistant

## 2021-11-01 ENCOUNTER — Encounter (HOSPITAL_COMMUNITY): Payer: Self-pay

## 2021-11-02 ENCOUNTER — Telehealth (HOSPITAL_COMMUNITY): Payer: No Payment, Other | Admitting: Physician Assistant

## 2022-03-15 DIAGNOSIS — Z419 Encounter for procedure for purposes other than remedying health state, unspecified: Secondary | ICD-10-CM | POA: Diagnosis not present

## 2022-04-04 ENCOUNTER — Telehealth: Payer: Self-pay

## 2022-04-04 NOTE — Telephone Encounter (Signed)
LVMTCB. AS, CMA 

## 2022-04-10 ENCOUNTER — Ambulatory Visit
Admission: RE | Admit: 2022-04-10 | Discharge: 2022-04-10 | Disposition: A | Discharge status: Home / Self Care | Payer: Medicaid Other | Source: Ambulatory Visit | Attending: Urgent Care | Admitting: Urgent Care

## 2022-04-10 VITALS — BP 124/86 | HR 94 | Temp 99.5°F | Resp 18

## 2022-04-10 DIAGNOSIS — J028 Acute pharyngitis due to other specified organisms: Secondary | ICD-10-CM

## 2022-04-10 DIAGNOSIS — B9689 Other specified bacterial agents as the cause of diseases classified elsewhere: Secondary | ICD-10-CM | POA: Diagnosis not present

## 2022-04-10 LAB — POCT RAPID STREP A (OFFICE): Rapid Strep A Screen: POSITIVE — AB

## 2022-04-10 MED ORDER — AMOXICILLIN 500 MG PO CAPS: 500.0000 mg | ORAL_CAPSULE | Freq: Two times a day (BID) | ORAL | 0 refills | Status: AC

## 2022-04-10 NOTE — ED Triage Notes (Signed)
Pt. Presents to UC w/ c/o a sore throat for the past 4 days.  

## 2022-04-10 NOTE — ED Provider Notes (Signed)
Nicholas [name]    CSN: [id number] Arrival date & time: 04/10/22  1646      History   Chief Complaint Chief Complaint  Patient presents with   Sore Throat    HPI Nicholas [name] is a 36 y.o. male.    Sore Throat    Patient presents to urgent care with complaint of sore throat x 4 days.  History reviewed. No pertinent past medical history.  Patient Active Problem List   Diagnosis Date Noted   Episodic mood disorder (HCC) 08/10/2021   Insomnia due to other mental disorder 08/10/2021   Attention deficit hyperactivity disorder (ADHD), combined type 11/17/2019   Generalized anxiety disorder 11/17/2019    Past Surgical History:  Procedure Laterality Date   ORIF TRIPOD FRACTURE Left 12/16/2012   Procedure: OPEN REDUCTION INTERNAL FIXATION (ORIF) TRIPOD FRACTURE with multiple approaches;  Surgeon: Darletta Moll, MD;  Location: [facility] OR;  Service: ENT;  Laterality: Left;       Home Medications    Prior to Admission medications   Medication Sig Start Date End Date Taking? Authorizing Provider  amphetamine-dextroamphetamine (ADDERALL XR) 30 MG 24 hr capsule Take 1 capsule (30 mg total) by mouth daily. 06/07/20   Mozingo, Thereasa Solo, NP  amphetamine-dextroamphetamine (ADDERALL XR) 30 MG 24 hr capsule Take 1 capsule (30 mg total) by mouth daily. 09/04/20   Mozingo, Thereasa Solo, NP  ARIPiprazole (ABILIFY) 15 MG tablet Take 0.5 tablets (7.5 mg total) by mouth daily. 09/14/21 09/14/22  Meta Hatchet, PA  atomoxetine (STRATTERA) 60 MG capsule Take 1 capsule (60 mg total) by mouth daily. 09/14/21 09/14/22  Nwoko, Tommas Olp, PA  buPROPion (WELLBUTRIN XL) 150 MG 24 hr tablet Take one tablet every morning for 7 days, then take two tablets every morning. 09/04/20   Mozingo, Thereasa Solo, NP  hydrOXYzine (ATARAX) 10 MG tablet Take 1 tablet (10 mg total) by mouth 3 (three) times daily as needed. 08/10/21   Nwoko, Tommas Olp, PA  ibuprofen (ADVIL,MOTRIN) 200 MG tablet Take 1,000 mg by  mouth every 6 (six) hours as needed for pain.    [provider]  oxyCODONE-acetaminophen (ROXICET) 5-325 MG per tablet Take 1-2 tablets by mouth every 4 (four) hours as needed for pain. 12/16/12   Newman Pies, MD    Family History History reviewed. No pertinent family history.  Social History Social History   Tobacco Use   Smoking status: Every Day    Packs/day: 1.00    Years: 6.00    Total pack years: 6.00    Types: Cigarettes   Smokeless tobacco: Never  Substance Use Topics   Alcohol use: Yes    Comment: occasionally   Drug use: Yes    Types: Marijuana    Comment: last used on 12-15-12     Allergies   Patient has no known allergies.   Review of Systems Review of Systems   Physical Exam Triage Vital Signs ED Triage Vitals  Enc Vitals Group     BP 04/10/22 1712 124/86     Pulse Rate 04/10/22 1712 94     Resp 04/10/22 1712 18     Temp 04/10/22 1712 99.5 F (37.5 C)     Temp Source 04/10/22 1712 Oral     SpO2 04/10/22 1712 98 %     Weight --      Height --      Head Circumference --      Peak Flow --  Pain Score 04/10/22 1713 0     Pain Loc --      Pain Edu? --      Excl. in GC? --    No data found.  Updated Vital Signs BP 124/86 (BP Location: Left Arm)   Pulse 94   Temp 99.5 F (37.5 C) (Oral)   Resp 18   SpO2 98%   Visual Acuity Right Eye Distance:   Left Eye Distance:   Bilateral Distance:    Right Eye Near:   Left Eye Near:    Bilateral Near:     Physical Exam Vitals reviewed.  Constitutional:      Appearance: He is well-developed.  HENT:     Mouth/Throat:     Pharynx: Posterior oropharyngeal erythema present.     Tonsils: Tonsillar exudate present. 4+ on the right. 3+ on the left.  Neurological:     General: No focal deficit present.     Mental Status: He is alert and oriented to person, place, and time.  Psychiatric:        Mood and Affect: Mood normal.        Behavior: Behavior normal.      UC Treatments / Results   Labs (all labs ordered are listed, but only abnormal results are displayed) Labs Reviewed - No data to display  EKG   Radiology No results found.  Procedures Procedures (including critical care time)  Medications Ordered in UC Medications - No data to display  Initial Impression / Assessment and Plan / UC Course  I have reviewed the triage vital signs and the nursing notes.  Pertinent labs & imaging results that were available during my care of the patient were reviewed by me and considered in my medical decision making (see chart for details).   Rapid strep is positive.  Treating with amoxicillin.   Final Clinical Impressions(s) / UC Diagnoses   Final diagnoses:  None   Discharge Instructions   None    ED Prescriptions   None    PDMP not reviewed this encounter.   Charma Igo, [place] 04/10/22 1724

## 2022-04-10 NOTE — Discharge Instructions (Signed)
Follow up here or with your primary care provider if your symptoms are worsening or not improving with treatment.     

## 2022-04-15 DIAGNOSIS — Z419 Encounter for procedure for purposes other than remedying health state, unspecified: Secondary | ICD-10-CM | POA: Diagnosis not present

## 2022-05-16 DIAGNOSIS — Z419 Encounter for procedure for purposes other than remedying health state, unspecified: Secondary | ICD-10-CM | POA: Diagnosis not present

## 2022-06-14 DIAGNOSIS — Z419 Encounter for procedure for purposes other than remedying health state, unspecified: Secondary | ICD-10-CM | POA: Diagnosis not present
